# Patient Record
Sex: Female | Born: 1945
Health system: Southern US, Community
[De-identification: ages and names within clinical notes are randomized; demographics above are authoritative.]

## PROBLEM LIST (undated history)

## (undated) DIAGNOSIS — E669 Obesity, unspecified: Secondary | ICD-10-CM

## (undated) DIAGNOSIS — F329 Major depressive disorder, single episode, unspecified: Secondary | ICD-10-CM

## (undated) DIAGNOSIS — F32A Depression, unspecified: Secondary | ICD-10-CM

## (undated) DIAGNOSIS — M1990 Unspecified osteoarthritis, unspecified site: Secondary | ICD-10-CM

## (undated) DIAGNOSIS — I10 Essential (primary) hypertension: Secondary | ICD-10-CM

## (undated) DIAGNOSIS — M797 Fibromyalgia: Secondary | ICD-10-CM

## (undated) DIAGNOSIS — E785 Hyperlipidemia, unspecified: Secondary | ICD-10-CM

## (undated) DIAGNOSIS — E039 Hypothyroidism, unspecified: Secondary | ICD-10-CM

## (undated) DIAGNOSIS — C801 Malignant (primary) neoplasm, unspecified: Secondary | ICD-10-CM

## (undated) DIAGNOSIS — M2142 Flat foot [pes planus] (acquired), left foot: Secondary | ICD-10-CM

## (undated) DIAGNOSIS — M2141 Flat foot [pes planus] (acquired), right foot: Secondary | ICD-10-CM

## (undated) HISTORY — DX: Essential (primary) hypertension: I10

## (undated) HISTORY — DX: Unspecified osteoarthritis, unspecified site: M19.90

## (undated) HISTORY — DX: Major depressive disorder, single episode, unspecified: F32.9

## (undated) HISTORY — DX: Hypothyroidism, unspecified: E03.9

## (undated) HISTORY — DX: Malignant (primary) neoplasm, unspecified: C80.1

## (undated) HISTORY — DX: Flat foot (pes planus) (acquired), right foot: M21.42

## (undated) HISTORY — DX: Flat foot (pes planus) (acquired), right foot: M21.41

## (undated) HISTORY — DX: Fibromyalgia: M79.7

## (undated) HISTORY — DX: Hyperlipidemia, unspecified: E78.5

## (undated) HISTORY — DX: Depression, unspecified: F32.A

## (undated) HISTORY — DX: Obesity, unspecified: E66.9

---
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
## 1975-12-07 HISTORY — PX: TUBAL LIGATION: SHX77

## 1992-12-06 HISTORY — PX: CHOLECYSTECTOMY: SHX55

## 1998-12-08 ENCOUNTER — Other Ambulatory Visit: Admission: RE | Admit: 1998-12-08 | Discharge: 1998-12-08 | Payer: Self-pay | Admitting: Obstetrics and Gynecology

## 2000-02-19 ENCOUNTER — Other Ambulatory Visit: Admission: RE | Admit: 2000-02-19 | Discharge: 2000-02-19 | Payer: Self-pay | Admitting: Obstetrics and Gynecology

## 2000-12-15 ENCOUNTER — Other Ambulatory Visit: Admission: RE | Admit: 2000-12-15 | Discharge: 2000-12-15 | Payer: Self-pay | Admitting: Obstetrics and Gynecology

## 2004-10-22 ENCOUNTER — Other Ambulatory Visit: Admission: RE | Admit: 2004-10-22 | Discharge: 2004-10-22 | Payer: Self-pay | Admitting: Obstetrics and Gynecology

## 2005-12-03 ENCOUNTER — Other Ambulatory Visit: Admission: RE | Admit: 2005-12-03 | Discharge: 2005-12-03 | Payer: Self-pay | Admitting: Obstetrics and Gynecology

## 2006-12-06 HISTORY — PX: ANTERIOR CRUCIATE LIGAMENT REPAIR: SHX115

## 2007-12-07 HISTORY — PX: TOTAL KNEE ARTHROPLASTY: SHX125

## 2008-10-11 ENCOUNTER — Inpatient Hospital Stay (HOSPITAL_COMMUNITY): Admission: RE | Admit: 2008-10-11 | Discharge: 2008-10-17 | Payer: Self-pay | Admitting: Orthopedic Surgery

## 2009-02-13 ENCOUNTER — Encounter: Payer: Self-pay | Admitting: Gastroenterology

## 2009-02-20 ENCOUNTER — Ambulatory Visit: Payer: Self-pay | Admitting: Gastroenterology

## 2009-02-25 ENCOUNTER — Telehealth: Payer: Self-pay | Admitting: Gastroenterology

## 2009-03-05 ENCOUNTER — Ambulatory Visit: Payer: Self-pay | Admitting: Gastroenterology

## 2009-03-05 ENCOUNTER — Encounter: Payer: Self-pay | Admitting: Gastroenterology

## 2009-03-06 ENCOUNTER — Telehealth: Payer: Self-pay | Admitting: Gastroenterology

## 2009-03-06 HISTORY — PX: TRANSTHORACIC ECHOCARDIOGRAM: SHX275

## 2009-03-06 HISTORY — PX: OTHER SURGICAL HISTORY: SHX169

## 2009-03-10 ENCOUNTER — Encounter: Payer: Self-pay | Admitting: Gastroenterology

## 2009-09-14 IMAGING — CR DG KNEE 1-2V PORT*R*
2 series · 2 of 2 positions shown · non-contrast
Comparison: None.

CLINICAL DATA: Status post right knee total prosthesis placement
for osteoarthritis.

PORTABLE RIGHT KNEE - 1-2 VIEW

[view not recorded (1 of 2)]
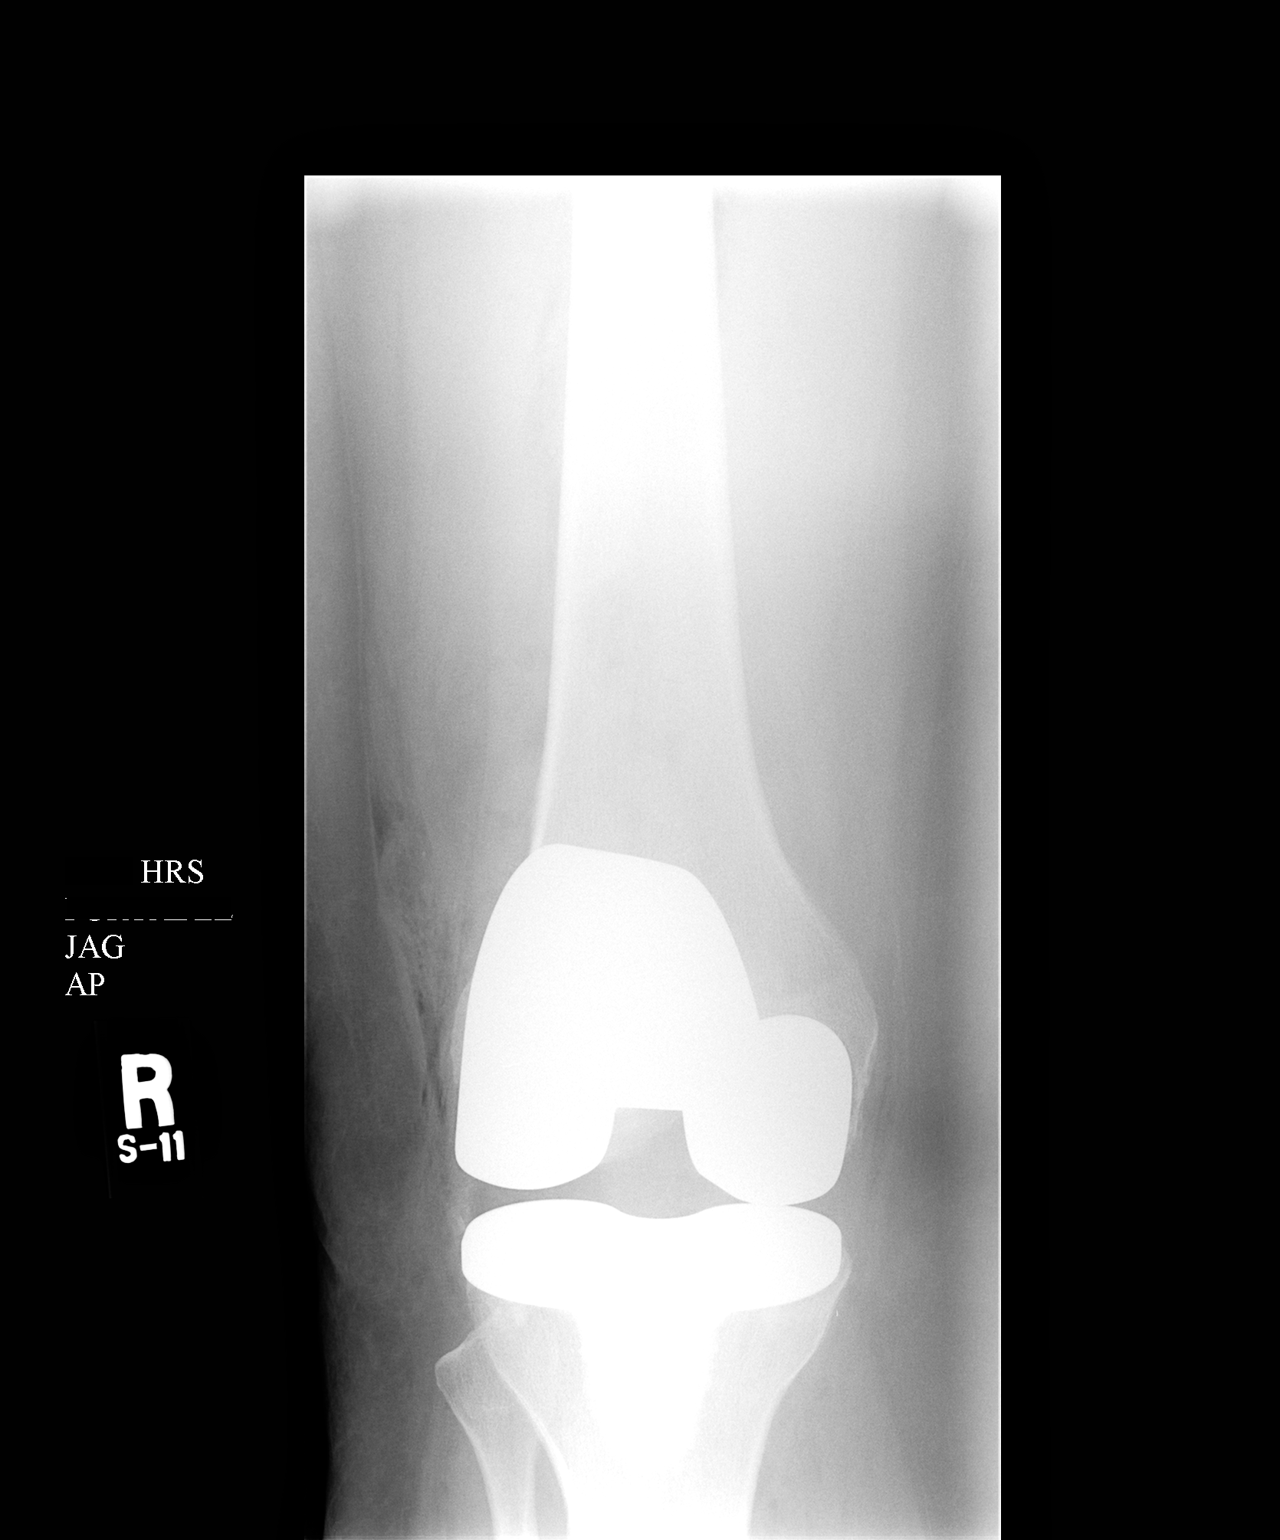

[view not recorded (2 of 2)]
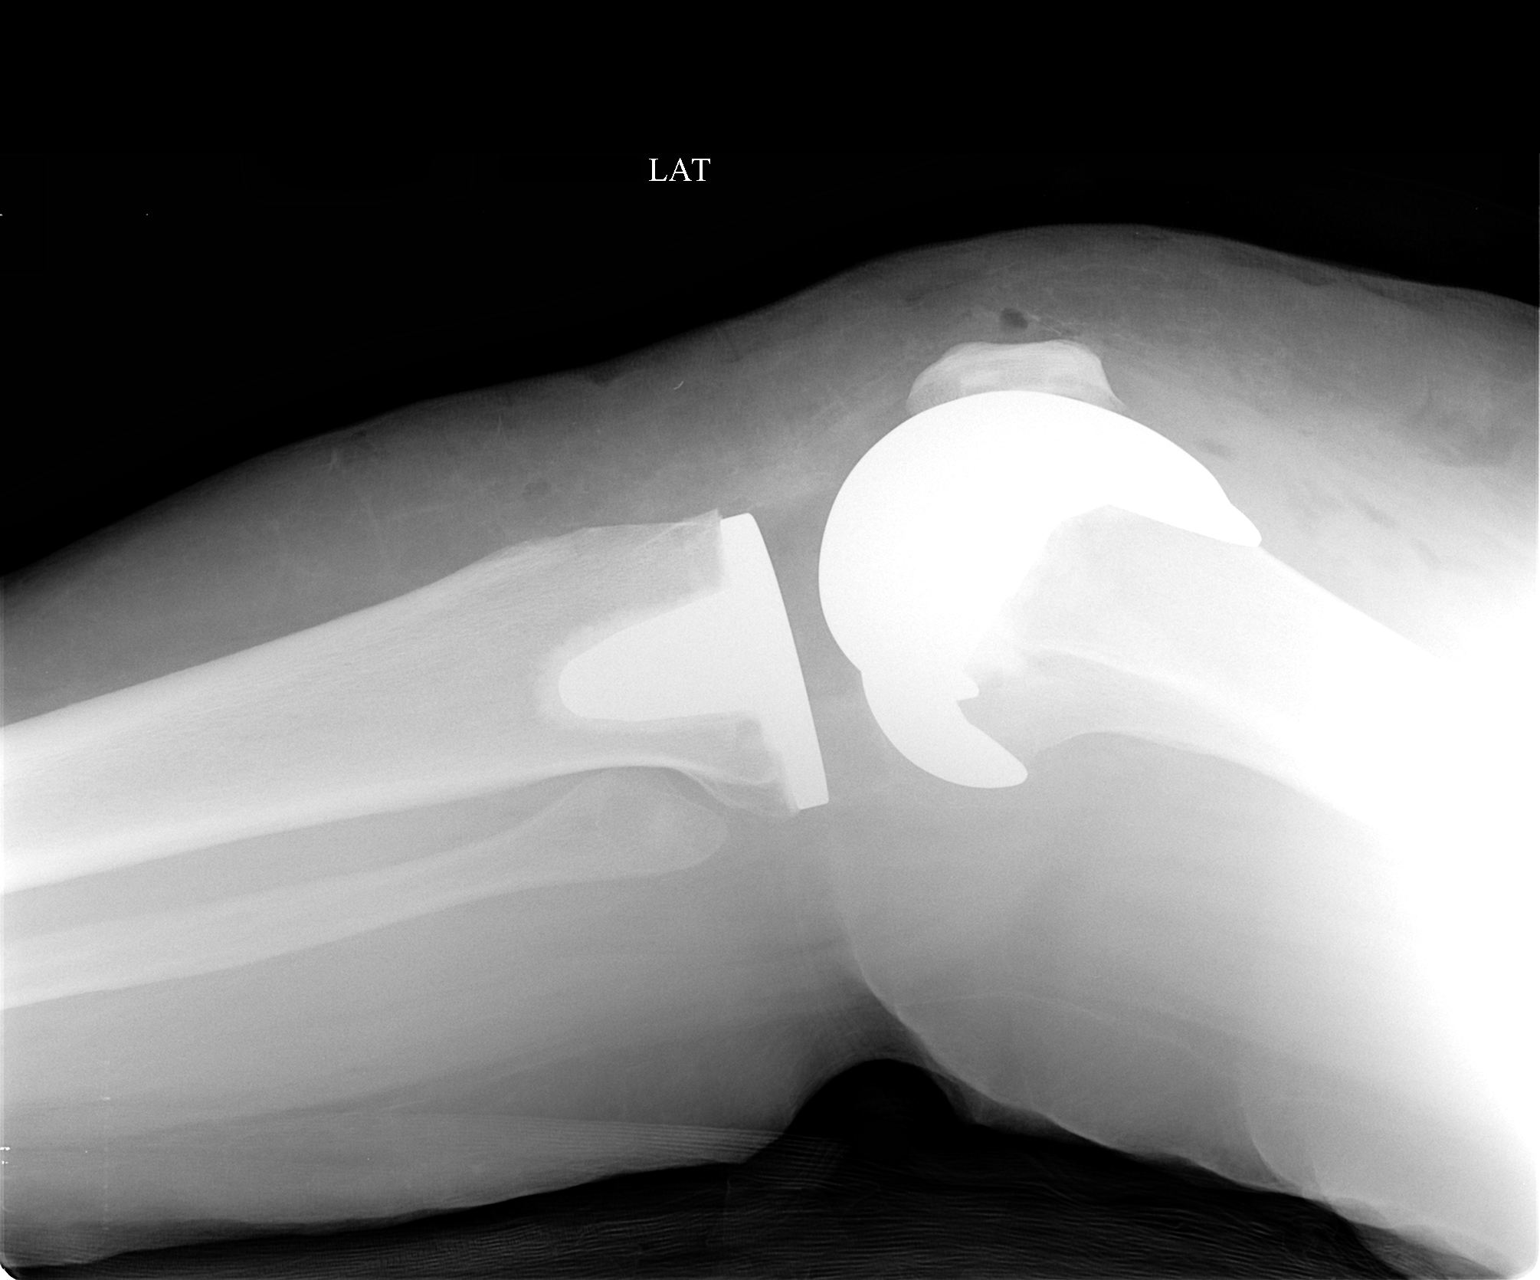

[2 of 2 positions shown; findings below may reference images not displayed]

FINDINGS: Right total knee prosthesis in satisfactory position and
alignment.  No fracture or dislocation seen.
IMPRESSION: Satisfactory postoperative appearance of a right total knee
prosthesis.

## 2011-03-01 ENCOUNTER — Other Ambulatory Visit: Payer: Self-pay | Admitting: Obstetrics and Gynecology

## 2011-03-01 DIAGNOSIS — R928 Other abnormal and inconclusive findings on diagnostic imaging of breast: Secondary | ICD-10-CM

## 2011-03-19 ENCOUNTER — Ambulatory Visit
Admission: RE | Admit: 2011-03-19 | Discharge: 2011-03-19 | Disposition: A | Discharge status: Home / Self Care | Payer: Medicare Other | Source: Ambulatory Visit | Attending: Obstetrics and Gynecology | Admitting: Obstetrics and Gynecology

## 2011-03-19 DIAGNOSIS — R928 Other abnormal and inconclusive findings on diagnostic imaging of breast: Secondary | ICD-10-CM

## 2011-04-20 NOTE — Op Note (Signed)
Alyssa, Park NO.:  [id number]   MEDICAL RECORD NO.:  [id number]          PATIENT TYPE:  INP   LOCATION:  5020                         FACILITY:  [facility]   PHYSICIAN:  Almedia Balls. [name], M.D. DATE OF BIRTH:  03-10-1946   DATE OF PROCEDURE:  10/11/2008  DATE OF DISCHARGE:                               OPERATIVE REPORT   PREOPERATIVE DIAGNOSIS:  Right knee end-stage osteoarthritis.   POSTOPERATIVE DIAGNOSIS:  Right knee end-stage osteoarthritis.   PROCEDURE PERFORMED:  Right total knee replacement using DePuy Sigma  rotating-platform prosthesis.   SURGEON:  Almedia Balls. Alyssa Patrick, MD   ASSISTANT:  Donnie Coffin. Dixon, PA-C.   ANESTHESIA:  General anesthesia was used.   ESTIMATED BLOOD LOSS:  Minimal.   FLUID REPLACEMENT:  15 mL crystalloid.   URINE OUTPUT:  200 mL.   TOURNIQUET TIME:  1 hour 30 minutes at 350 mmHg.   INSTRUMENT COUNTS:  Correct.   COMPLICATIONS:  There were no complications.   Perioperative antibiotics were given.   INDICATIONS:  The patient is a 65 year old female with a history of  worsening right knee pain.  The patient has failed conservative  management and complains of disabling pain and functional loss, desiring  a total knee replacement.  Informed consent was obtained.   DESCRIPTION OF PROCEDURE:  After an adequate level of anesthesia was  achieved, the patient was positioned supine on the operating room table.  A bump was used  under the right hip.  A nonsterile tourniquet was  placed on the right proximal thigh.  The right leg was sterilely prepped  and draped in the usual manner.  We exsanguinated the limb using an  Esmarch bandage and elevated the tourniquet to 350 mmHg.  A longitudinal  midline incision was created with the knee in flexion.  Median  parapatellar arthrotomy was performed with 10 blade.  The patella was  everted, lateral patellar femoral ligaments were divided.  Distal femur  was entered using a step-cut  drill.  We resected 10 mm off the femur, 75  degrees right.  We next went ahead and cut the tibia.  We performed a 90  degree perpendicular cut after subluxing the tibia anteriorly having  divided the PCL and ACL.  This was 4 mm off the affected medial side.  We then went ahead and checked our extension block, it was a little  tight.  We then resected 2 more mm off and I gave this a nice extension  gap.  We then went ahead and did out 4 in 1 guide for the distal femur  resecting anterior, posterior, and chamfer cuts, we sized this to a size  3 on the femur.  We then checked our flexion extension gaps again.  They  were symmetric at 10 mm.  At this point, we removed excess posterior  bone off the posterior femoral condyles.  We then went ahead and  prepared the tibia, was sized to 2.5 and used a modular drill and keel  punch to finish preparation of the tibia and placed a tibial tray on and  this fit well.  Next, we went ahead and resected the notch using the box-  cut guide over the posterior cruciate substituting prosthesis, this was  a size 3 and then we used a size 3 trial and then packed it in place.  We placed a 12.5 insert and placed in equal extension.  We were able to  achieve full extension with good flexion and extension instability.  We  next used a patellar resection guide to go ahead and resect her patella.  We did resurfacing technique, started about 20-22 mm thickness, we  resected down the 13 and then placed a 32 patellar button in place and  then took the knee to a full range of motion, excellent patellar  balance, and tracking was noted.  We have removed all trial components,  thoroughly irrigated, plugged the end of the femur with available bone.  We then cemented the components into place, 2.5 tibia, 3 right femur,  and then a 12.5 poly insert, and then the 32 patella.  Once the cement  was allowed to harden, we removed the excess cement with 0.25-inch  curved  osteotome.  We trialed again with 12.5, we were happy with that  and we selected the real 12.5 tibial insert and then took the knee  through a full range of motion.  We were happy with soft tissue balance  and stability, and then went ahead and closed using a #1 Vicryl suture  in a figure-of-eight fashion to the median parapatellar arthrotomy, 0  and 2-0 Vicryl layer closure for the subcutaneous tissues, and 4-0  Monocryl for skin.  Steri-strips were applied followed by sterile  dressing.  The patient tolerated the surgery well.      Almedia Balls. [name], M.D.  Electronically Signed     SRN/MEDQ  D:  10/11/2008  T:  10/12/2008  Job:  [id number]

## 2011-04-20 NOTE — H&P (Signed)
[name], [name] NO.:  [id number]   MEDICAL RECORD NO.:  [id number]          PATIENT TYPE:  INP   LOCATION:  NA                           FACILITY:  [facility]   PHYSICIAN:  Almedia Balls. Ranell Patrick, M.D. DATE OF BIRTH:  [birth year]   DATE OF ADMISSION:  DATE OF DISCHARGE:                              HISTORY & PHYSICAL   CHIEF COMPLAINT:  Right knee pain.   HISTORY OF PRESENT ILLNESS:  The patient is a 65 year old female with  worsening right knee pain secondary to osteoarthritis that has been  refractory to conservative treatment.  The patient has elected to have a  right total knee arthroplasty by Dr. Malon Kindle.   PAST MEDICAL HISTORY:  1. Hyperlipidemia.  2. Migraines.  3. Anxiety.  4. Fibromyalgia.  5. Irritable bowel syndrome.   FAMILY MEDICAL HISTORY:  Coronary artery disease and hypertension.   SOCIAL HISTORY:  The patient is at [facility].  Does not smoke or  use alcohol.   DRUG ALLERGIES:  COMPAZINE.   CURRENT MEDICATIONS:  Hydrochlorothiazide, Effexor, Synthroid,  amitriptyline, pravastatin, Darvocet, alprazolam.  Please check the  dosages with the patient upon arrival.   REVIEW OF SYSTEMS:  Pain with ambulation and also chronic constipation.   PHYSICAL EXAMINATION:  VITAL SIGNS:  Pulse 80, respirations 18, blood  pressure 132/80.  GENERAL:  The patient is a healthy-appearing 65 year old female in no  acute distress, pleasant mood and affect, oriented x3.  HEAD and NECK:  Cranial nerves II through XII grossly intact.  She has  no tenderness.  LUNGS:  Active breath sounds bilaterally.  HEART:  Regular rate and rhythm.  No murmur.  ABDOMEN:  Nontender, nondistended with active bowel sounds.  EXTREMITIES:  Moderate tenderness through right knee and medial joint  line tenderness through the right knee with crepitus.  She does have  mildly antalgic gait, but neurovascularly, she is intact.  Distally,  there is some minimal pedal edema  bilaterally.  SKIN:  No rashes.   X-rays show end-stage osteoarthritis of the right knee.   IMPRESSION:  End-stage osteoarthritis, right knee.   PLAN:  Plan of action is to have right total knee arthroplasty by Dr.  Malon Kindle.      Thomas B. Durwin Nora, P.A.      Almedia Balls. Ranell Patrick, M.D.  Electronically Signed    TBD/MEDQ  D:  10/02/2008  T:  10/03/2008  Job:  [id number]

## 2011-04-23 NOTE — Discharge Summary (Signed)
[name], [name] NO.:  [id number]   MEDICAL RECORD NO.:  [id number]          PATIENT TYPE:  INP   LOCATION:  5020                         FACILITY:  [facility]   PHYSICIAN:  Almedia Balls. Ranell Patrick, M.D. DATE OF BIRTH:  17-Sep-1946   DATE OF ADMISSION:  10/11/2008  DATE OF DISCHARGE:  10/17/2008                               DISCHARGE SUMMARY   ADMITTING DIAGNOSIS:  Right knee, end-stage osteoarthritis.   DISCHARGE DIAGNOSIS:  Right knee, end-stage osteoarthritis.   PROCEDURE PERFORMED:  Right total knee replacement performed on October 11, 2008, by Dr. Ranell Patrick.   CONSULTING SERVICES:  Physical Therapy, Occupational Therapy, discharge  planning, and Pharmacy.   HISTORY OF PRESENT ILLNESS:  The patient is a 65 year old female with  worsening right knee pain secondary to known arthritis.  The patient has  failed conservative management and desires operative treatment.  She  presents now for total knee arthroplasty.  Risks and benefits have been  discussed.  Informed consent obtained.  For further details and the  patient's past medical history and physical examination, please see the  medical record.   HOSPITAL COURSE:  The patient was admitted by orthopedics on October 11, 2008, when she underwent uncomplicated total knee arthroplasty using  DePuy prosthesis.  Postoperatively, the patient was weightbearing as  tolerated after physical therapy.  ADLs with occupational therapy and  Pharmacy for Coumadin monitoring, which was instituted on the data of  surgery.  She was also started on mechanical devices including TED hose  and Plexipulse foot pumps.  Her H and H was watched postoperatively and  she had a slight anemia that was nonsymptomatic.  The patient was  scheduled for discharge on October 17, 2008, in stable condition on  Percocet and Robaxin and to follow up with [place] in 2 weeks.  She will be  on Coumadin, monitored outpatient through home health agency.  Weightbearing as tolerated.      Almedia Balls. Ranell Patrick, M.D.  Electronically Signed     SRN/MEDQ  D:  12/04/2008  T:  12/05/2008  Job:  [id number]

## 2011-09-07 LAB — BASIC METABOLIC PANEL
BUN: 17
BUN: 7
BUN: 7
CO2: 28
Calcium: 9.6
Chloride: 101
Chloride: 99
Creatinine, Ser: 0.9
Creatinine, Ser: 0.93
Creatinine, Ser: 0.98
GFR calc non Af Amer: 58 — ABNORMAL LOW
Glucose, Bld: 102 — ABNORMAL HIGH
Glucose, Bld: 137 — ABNORMAL HIGH
Sodium: 137

## 2011-09-07 LAB — CBC
HCT: 32.5 — ABNORMAL LOW
HCT: 32.8 — ABNORMAL LOW
HCT: 32.8 — ABNORMAL LOW
HCT: 40
Hemoglobin: 10.9 — ABNORMAL LOW
Hemoglobin: 11.1 — ABNORMAL LOW
MCHC: 33.1
MCHC: 33.3
MCV: 88.5
MCV: 89.4
MCV: 90.7
Platelets: 219
Platelets: 237
RBC: 3.7 — ABNORMAL LOW
RBC: 4.47
RDW: 14.4
RDW: 14.6
WBC: 7.2
WBC: 7.4

## 2011-09-07 LAB — URINALYSIS, ROUTINE W REFLEX MICROSCOPIC
Glucose, UA: NEGATIVE
Nitrite: NEGATIVE

## 2011-09-07 LAB — ABO/RH: ABO/RH(D): A POS

## 2011-09-07 LAB — DIFFERENTIAL
Eosinophils Absolute: 0
Eosinophils Relative: 0
Lymphocytes Relative: 33
Lymphs Abs: 1.9
Monocytes Relative: 8
Neutro Abs: 3.3
Neutrophils Relative %: 58

## 2011-09-07 LAB — URINE MICROSCOPIC-ADD ON

## 2011-09-07 LAB — PROTIME-INR
INR: 1
INR: 1.2
INR: 1.4
INR: 2.2 — ABNORMAL HIGH
INR: 2.2 — ABNORMAL HIGH
Prothrombin Time: 12.9
Prothrombin Time: 18 — ABNORMAL HIGH

## 2011-09-07 LAB — TYPE AND SCREEN: Antibody Screen: NEGATIVE

## 2012-02-20 IMAGING — MG MM DIGITAL DIAGNOSTIC UNILAT R {BCG}
3 series · 3 of 3 positions shown · non-contrast
Comparison: Multiple priors

CLINICAL DATA: Abnormal screening, right breast

DIGITAL DIAGNOSTIC RIGHT MAMMOGRAM WITHOUT CAD

[R CC]
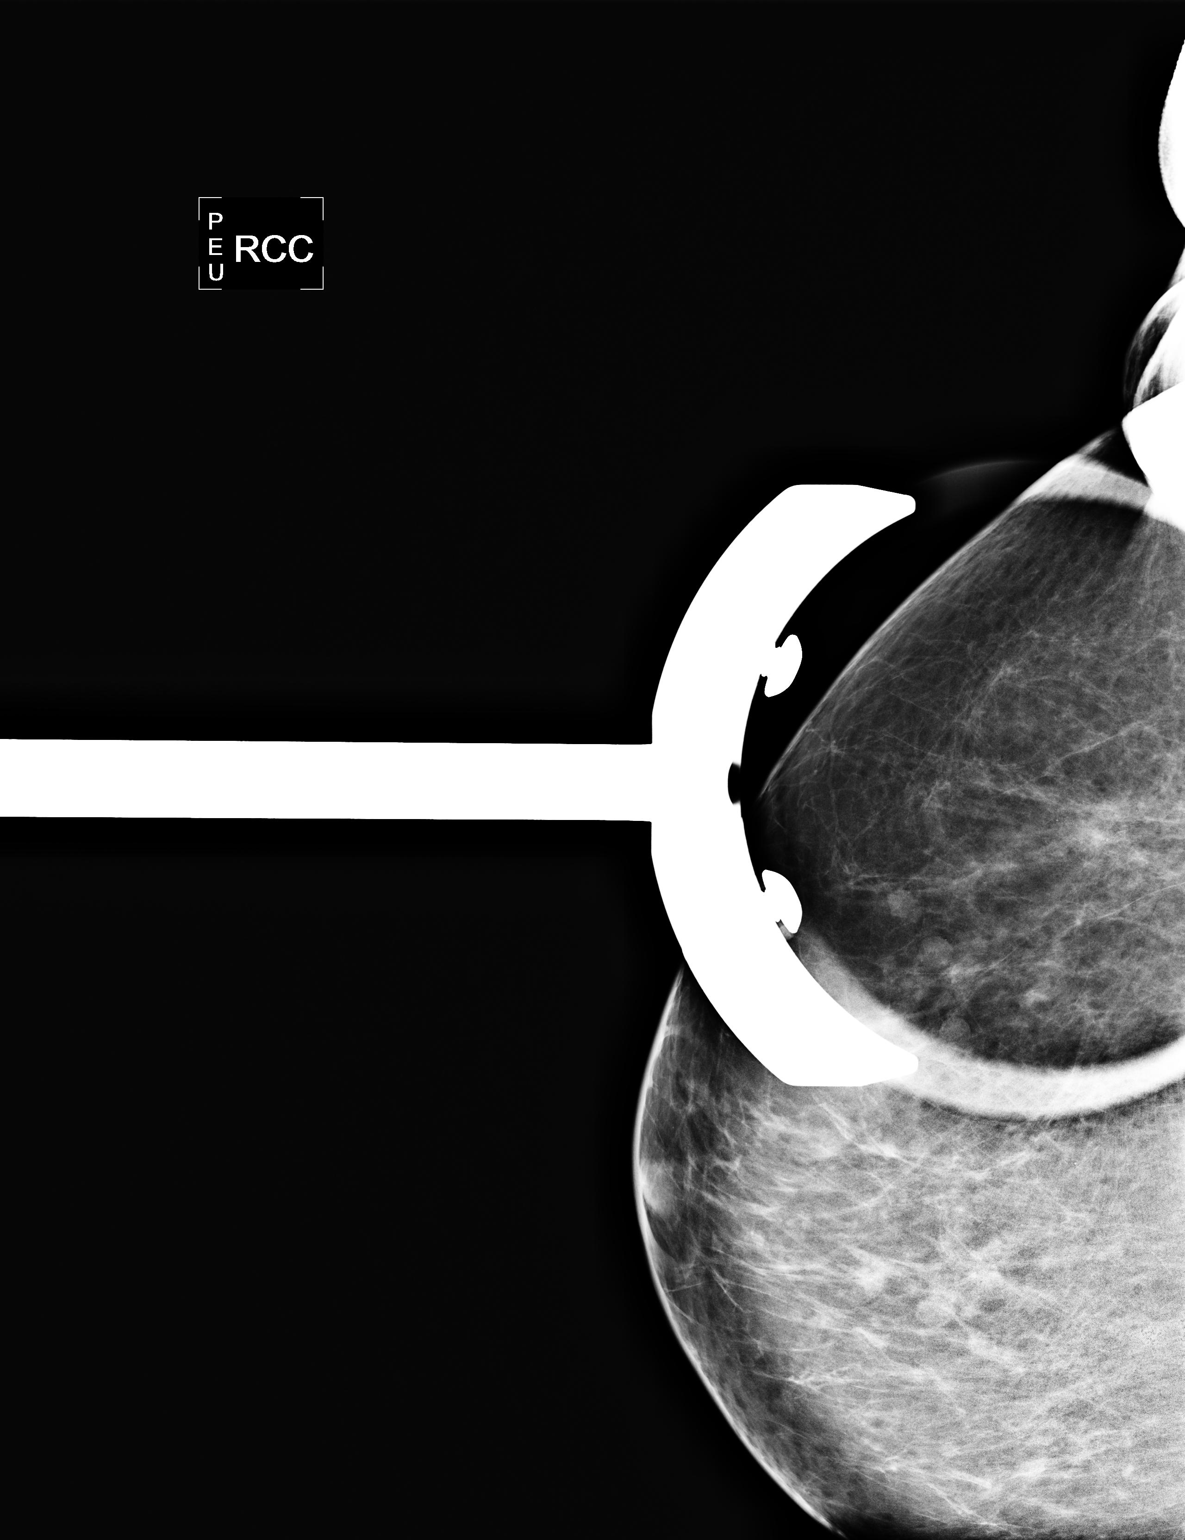

[R MLO (1 of 2)]
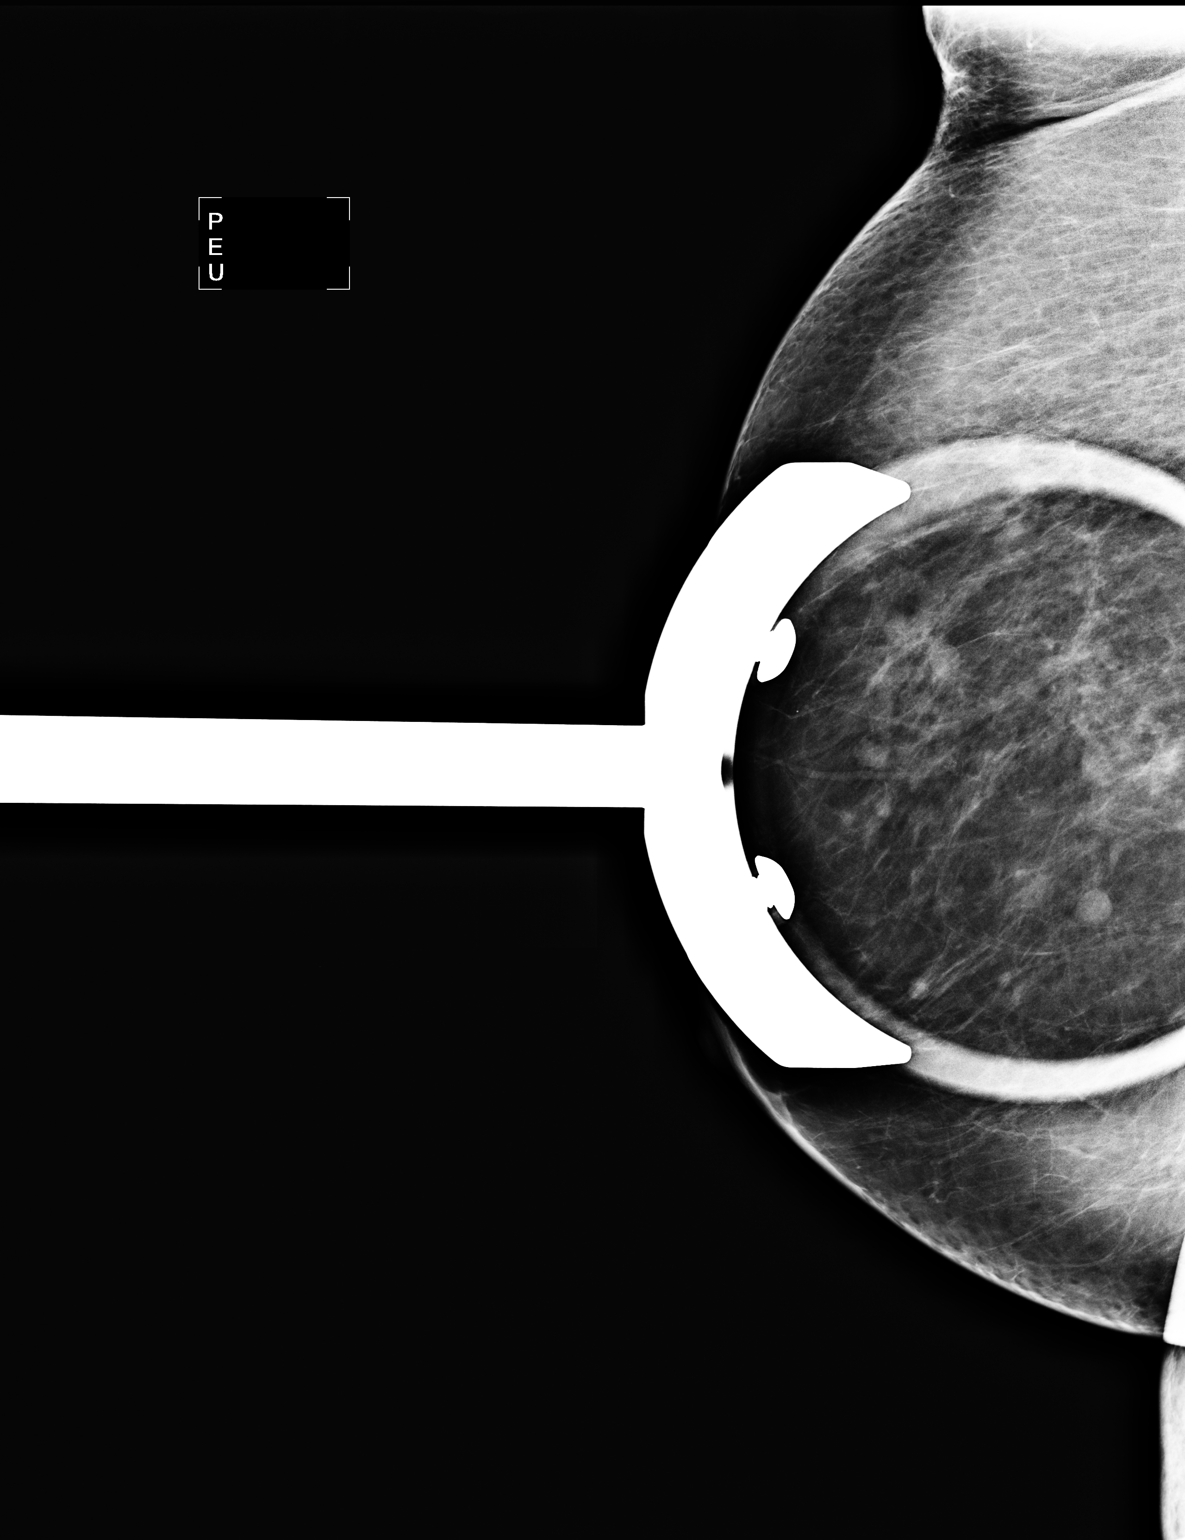

[R MLO (2 of 2)]
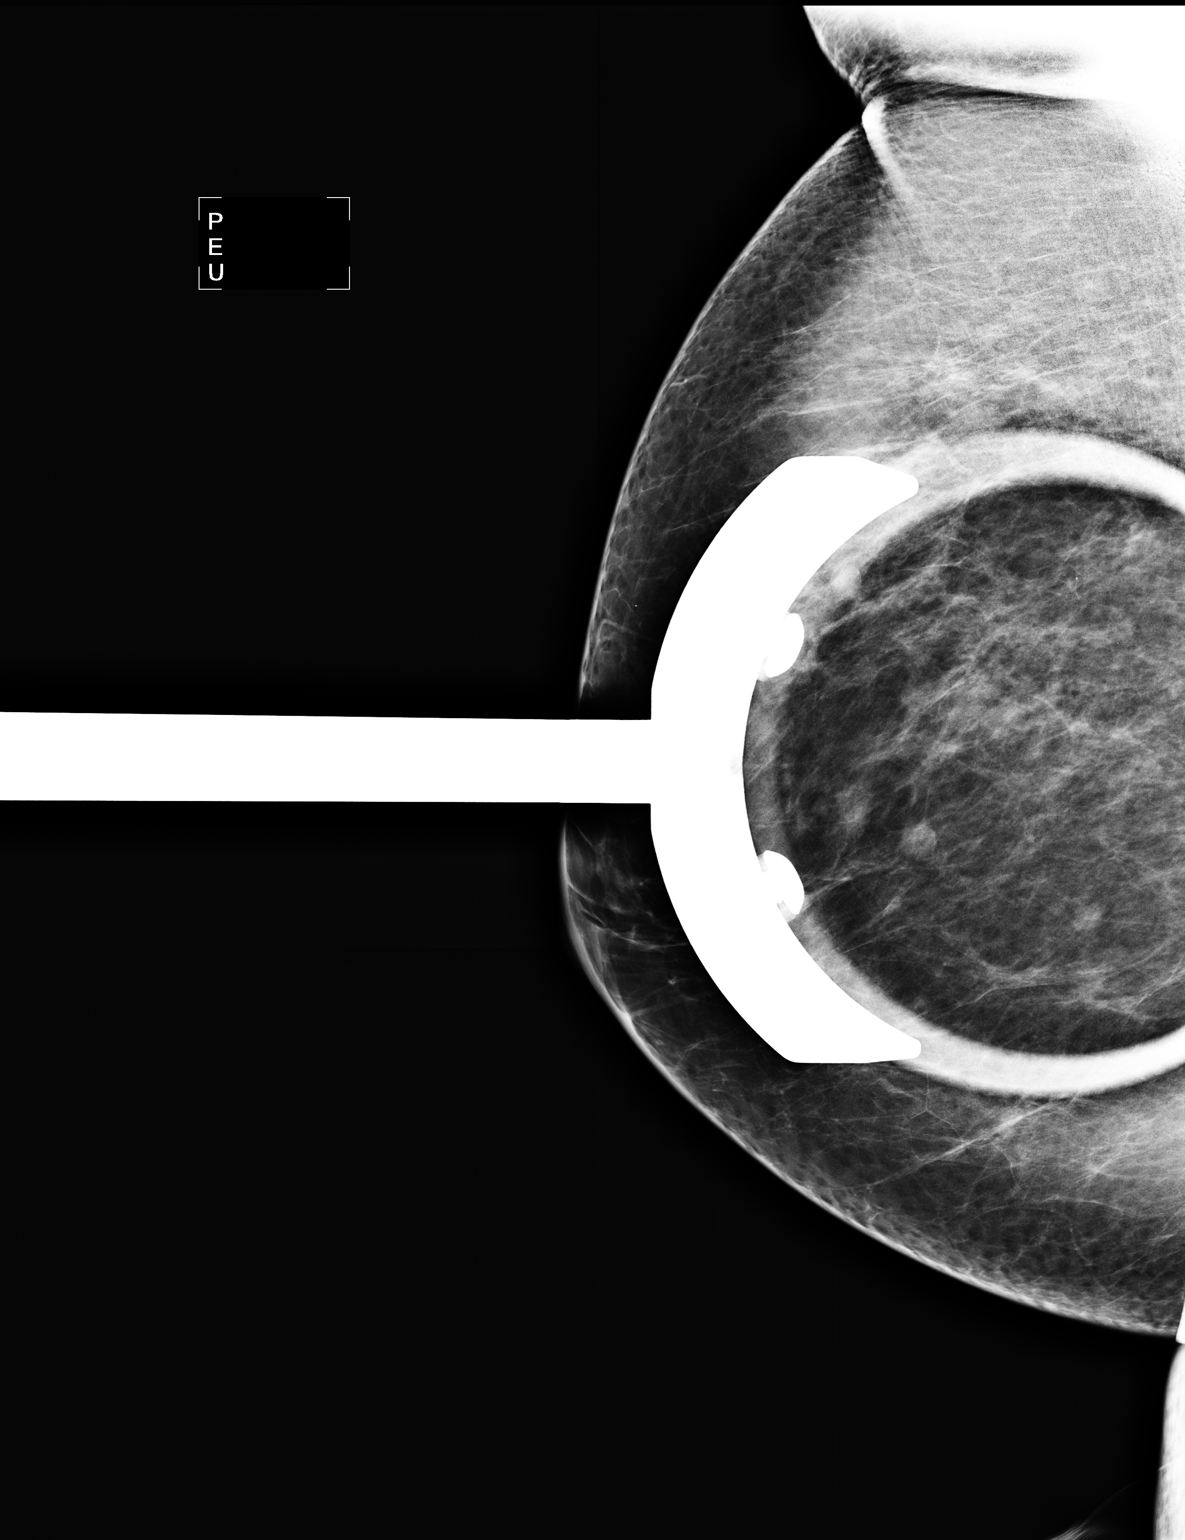

[3 of 3 positions shown; findings below may reference images not displayed]

FINDINGS: Spot compression views in the upper-outer quadrant of
the right breast, posteriorly demonstrate fibroglandular tissue
without new mass or persistent distortion.  Compared to priors.
IMPRESSION: No mammographic evidence of malignancy, right breast.  Recommend
routine screening mammography in 1 year.

BI-RADS CATEGORY 1:  Negative.

## 2012-04-05 ENCOUNTER — Telehealth: Payer: Self-pay | Admitting: Gastroenterology

## 2012-04-05 NOTE — Telephone Encounter (Signed)
Pt last seen in 2010; last COLON 03/05/2009 with diverticulosis and hyperplastic rectal polyps. Pt reports several days of burning in her lower abdomen and bloating. She denies diarrhea, melena or a temp. She reports the lower abdomen is tender to the touch and hurts when she gets up from a sitting position. Pt given an appt with Mike Gip, PA tomorrow.

## 2012-04-06 ENCOUNTER — Ambulatory Visit: Payer: Medicare Other | Admitting: Physician Assistant

## 2013-05-29 ENCOUNTER — Other Ambulatory Visit: Payer: Self-pay

## 2013-05-29 MED ORDER — CARVEDILOL 6.25 MG PO TABS: 6.2500 mg | ORAL_TABLET | Freq: Two times a day (BID) | ORAL | Status: DC

## 2013-05-29 NOTE — Addendum Note (Signed)
Addended by: Neta Ehlers on: 05/29/2013 08:24 AM   Modules accepted: Orders

## 2013-05-29 NOTE — Telephone Encounter (Signed)
Rx was sent to pharmacy electronically. 

## 2013-09-24 ENCOUNTER — Encounter: Payer: Self-pay | Admitting: Cardiology

## 2013-11-06 ENCOUNTER — Encounter: Payer: Self-pay | Admitting: Cardiology

## 2013-11-06 ENCOUNTER — Ambulatory Visit (INDEPENDENT_AMBULATORY_CARE_PROVIDER_SITE_OTHER): Payer: Medicare Other | Admitting: Cardiology

## 2013-11-06 VITALS — BP 118/70 | HR 65 | Ht 66.0 in | Wt 204.7 lb

## 2013-11-06 DIAGNOSIS — I10 Essential (primary) hypertension: Secondary | ICD-10-CM

## 2013-11-06 DIAGNOSIS — E785 Hyperlipidemia, unspecified: Secondary | ICD-10-CM

## 2013-11-06 DIAGNOSIS — E669 Obesity, unspecified: Secondary | ICD-10-CM

## 2013-11-06 NOTE — Progress Notes (Signed)
PATIENT: BLAISE PALLADINO MRN: [id number]  DOB: 02-Apr-1946   DOV:11/08/2013 PCP: Ailene Ravel, MD  Clinic Note: Chief Complaint  Patient presents with  . Annual Exam    No chest pain, SOB, edema or dizziness.    HPI: BRADLEY HANDYSIDE is a 67 y.o.  female with a PMH below who presents today for annual followup for hypertension and hyperlipidemia, who also has a significant family history (father had aortic stenosis as well as coronary disease status post CABG). I last saw her in December 2013. At that time she is doing quite well, no longer bothered by palpitations. She was tolerating statin well and her blood pressure is well-controlled. She had had some wavering of her weight loss but is partly related to him having to take care of her husband recovering from CVA.  Interval History: Since her last visit, she seen to be done quite well without any major symptoms. She still not really exercising she wanted to him. It's just been hard for her to get back to doing it. She her husband and start her walking and she knows she needs to get back to doing it with him.  Cardiovascular ROS: no chest pain or dyspnea on exertion negative for - chest pain, edema, irregular heartbeat, loss of consciousness, murmur, orthopnea, palpitations, paroxysmal nocturnal dyspnea, rapid heart rate, shortness of breath or  TIA/amaurosis fugax. No melena, hematochezia or hematuria.: No claudication symptoms. She did have one episode where she passed out after having knee surgery but has not had anything else and statin I was quite a long time ago.  Past Medical History  Diagnosis Date  . Hypertension, essential, benign   . Hyperlipidemia   . Mild obesity   . Hypothyroidism   . Depression, controlled     Dysthymia  . Fibromyalgia   . Pes planus (flat feet)     Prior Cardiac Evaluation and Past Surgical History: Past Surgical History  Procedure Laterality Date  . Transthoracic echocardiogram  April 2010    Normal  EF. Relaxation. Otherwise normal  . Nm persantine myoview ltd  April 2010    Breast attenuation, no ischemia or infarction. Normal function  . Carotid dopplers  April 2010    Tortuosity but no stenosis   1. Echocardiogram April 2010: EF normal with impaired relaxation, otherwise grossly normal. 2. Persantine Myoview April 2010: Breast attenuation with no ischemia or infarction. 3. Carotid Dopplers April 2010 for possible syncope showed tortuosity but no diameter reduction.   Allergies  Allergen Reactions  . Compazine [Prochlorperazine Edisylate]     Muscle spasms  . Prochlorperazine Edisylate     REACTION: Extra pyrimadal Syndrome    Current Outpatient Prescriptions  Medication Sig Dispense Refill  . carvedilol (COREG) 6.25 MG tablet Take 1 tablet (6.25 mg total) by mouth 2 (two) times daily.  180 tablet  2  . levothyroxine (SYNTHROID, LEVOTHROID) 75 MCG tablet Take 75 mcg by mouth daily before breakfast.      . lisinopril (PRINIVIL,ZESTRIL) 10 MG tablet Take 10 mg by mouth daily.      . pravastatin (PRAVACHOL) 20 MG tablet Take 20 mg by mouth daily.      Marland Kitchen venlafaxine XR (EFFEXOR-XR) 75 MG 24 hr capsule Take 75 mg by mouth daily.       No current facility-administered medications for this visit.    History   Social History Narrative   She is a married mother of 2, grandmother 4. She never smoked and does not  drink alcohol.   She does not get routine exercise, and admitted he needs to.   ROS: A comprehensive Review of Systems - Negative except Occasional orthostatic dizziness but nothing that bothers her. Lack of energy.  PHYSICAL EXAM BP 118/70  Pulse 65  Ht 5\' 6"  (1.676 m)  Wt 92.851 kg (204 lb 11.2 oz)  BMI 33.06 kg/m2 (198 last year) General appearance: alert, cooperative, appears stated age, no distress, mildly obese and Pleasant mood and affect. Well-nourished and well-groomed. Neck: no adenopathy, no carotid bruit, no JVD, supple, symmetrical, trachea midline,  thyroid not enlarged, symmetric, no tenderness/mass/nodules and HEENT: [place]/AT, EOMI, MMM Lungs: clear to auscultation bilaterally, normal percussion bilaterally and Nonlabored, and air movement. Heart: regular rate and rhythm, S1, S2 normal, no murmur, click, rub or gallop and normal apical impulse Abdomen: soft, non-tender; bowel sounds normal; no masses,  no organomegaly and Mild obesity Extremities: extremities normal, atraumatic, no cyanosis or edema and no ulcers, gangrene or trophic changes Pulses: 2+ and symmetric Neurologic: Grossly normal  [id number] today: Yes Rate: 65 , Rhythm: NSR, normal ECG;  Recent Labs: Lipid Check by PCP 6 months ago, was told that her labs were "okay "  ASSESSMENT / PLAN: Hypertension, essential, benign Very well controlled on current regimen. She is on low dose of beta blocker and ACE inhibitor. No changes.  Hyperlipidemia This is then followed by her primary physician. She is on pravastatin.  Mild obesity Again discussed the need to increase her exercise regimen. She needs to get into a routine of exercising starting with  3-5 days a week of 10-15 minutes increasing up to 25-30 minutes. Also discussed potential dietary modifications with a healthy diet choices.    Orders Placed This Encounter  Procedures  . EKG 12-Lead    Followup: 1 yr  Aribelle Mccosh W. Herbie Baltimore, M.D., M.S. [facility] [address] [place], [place]  [address]  [phone] Pager # [phone]

## 2013-11-06 NOTE — Patient Instructions (Signed)
Continue current medications.  Dr. Herbie Baltimore recommends that you schedule a follow-up appointment in: One year.

## 2013-11-08 ENCOUNTER — Encounter: Payer: Self-pay | Admitting: Cardiology

## 2013-11-08 DIAGNOSIS — I10 Essential (primary) hypertension: Secondary | ICD-10-CM | POA: Insufficient documentation

## 2013-11-08 DIAGNOSIS — E669 Obesity, unspecified: Secondary | ICD-10-CM | POA: Insufficient documentation

## 2013-11-08 DIAGNOSIS — E785 Hyperlipidemia, unspecified: Secondary | ICD-10-CM | POA: Insufficient documentation

## 2013-11-08 NOTE — Assessment & Plan Note (Signed)
Very well controlled on current regimen. She is on low dose of beta blocker and ACE inhibitor. No changes.

## 2013-11-08 NOTE — Assessment & Plan Note (Signed)
This is then followed by her primary physician. She is on pravastatin.

## 2013-11-08 NOTE — Assessment & Plan Note (Signed)
Again discussed the need to increase her exercise regimen. She needs to get into a routine of exercising starting with  3-5 days a week of 10-15 minutes increasing up to 25-30 minutes. Also discussed potential dietary modifications with a healthy diet choices.

## 2014-02-22 ENCOUNTER — Other Ambulatory Visit: Payer: Self-pay | Admitting: *Deleted

## 2014-02-22 MED ORDER — CARVEDILOL 6.25 MG PO TABS: 6.2500 mg | ORAL_TABLET | Freq: Two times a day (BID) | ORAL | Status: DC

## 2014-02-26 ENCOUNTER — Other Ambulatory Visit: Payer: Self-pay | Admitting: *Deleted

## 2014-09-07 ENCOUNTER — Other Ambulatory Visit: Payer: Self-pay | Admitting: Cardiology

## 2014-09-09 NOTE — Telephone Encounter (Signed)
Rx was sent to pharmacy electronically. 

## 2014-10-29 ENCOUNTER — Other Ambulatory Visit: Payer: Self-pay | Admitting: Obstetrics and Gynecology

## 2014-11-01 LAB — CYTOLOGY - PAP

## 2014-11-11 ENCOUNTER — Encounter: Payer: Self-pay | Admitting: Cardiology

## 2014-11-11 ENCOUNTER — Ambulatory Visit (INDEPENDENT_AMBULATORY_CARE_PROVIDER_SITE_OTHER): Payer: Medicare Other | Admitting: Cardiology

## 2014-11-11 VITALS — BP 114/64 | HR 66 | Ht 65.75 in | Wt 207.0 lb

## 2014-11-11 DIAGNOSIS — I10 Essential (primary) hypertension: Secondary | ICD-10-CM

## 2014-11-11 DIAGNOSIS — E785 Hyperlipidemia, unspecified: Secondary | ICD-10-CM

## 2014-11-11 DIAGNOSIS — E669 Obesity, unspecified: Secondary | ICD-10-CM

## 2014-11-11 DIAGNOSIS — R002 Palpitations: Secondary | ICD-10-CM

## 2014-11-11 DIAGNOSIS — Z8249 Family history of ischemic heart disease and other diseases of the circulatory system: Secondary | ICD-10-CM

## 2014-11-11 MED ORDER — LISINOPRIL 10 MG PO TABS: 10.0000 mg | ORAL_TABLET | Freq: Every day | ORAL | Status: DC

## 2014-11-11 MED ORDER — PRAVASTATIN SODIUM 20 MG PO TABS: 20.0000 mg | ORAL_TABLET | Freq: Every day | ORAL | Status: DC

## 2014-11-11 MED ORDER — CARVEDILOL 6.25 MG PO TABS: 6.2500 mg | ORAL_TABLET | Freq: Two times a day (BID) | ORAL | Status: DC

## 2014-11-11 NOTE — Patient Instructions (Signed)
Dr Harding recommends that you schedule a follow-up appointment in 1 year. You will receive a reminder letter in the mail two months in advance. If you don't receive a letter, please call our office to schedule the follow-up appointment. 

## 2014-11-11 NOTE — Progress Notes (Signed)
PCP: Leonides Sake, MD  Clinic Note: Chief Complaint  Patient presents with  . Follow-up    Annual; family history of aortic stenosis and coronary disease  . Hypertension  . Hyperlipidemia   HPI: [name] is a 68 y.o. female with a PMH below who presents today for annual follow-up of hypertension and hyperlipidemia with a family history of cardiac disease including aortic stenosis and coronary disease in her father. Last seen in December 2014. She had been troubled by palpitations that were controlled at that time.   Past Medical History  Diagnosis Date  . Hypertension, essential, benign   . Hyperlipidemia   . Mild obesity   . Hypothyroidism   . Depression, controlled     Dysthymia  . Fibromyalgia   . Pes planus (flat feet)     Prior Cardiac Evaluation and Past Surgical History: Past Surgical History  Procedure Laterality Date  . Transthoracic echocardiogram  April 2010    Normal EF. Relaxation. Otherwise normal  . Nm persantine myoview ltd  April 2010    Breast attenuation, no ischemia or infarction. Normal function  . Carotid dopplers  April 2010    Tortuosity but no stenosis   Interval History: She has done well over the last year with no major complaints. She has some mild orthostatic hypotension dizziness, but has not had any syncope or near syncope, TIA/amaurosis fugax. No resting or exertional chest tightness/pressure or dyspnea. She has chronic allergy and cold symptoms.  No heart failure symptoms of PND, orthopnea or edema. No myalgias or arthralgias. She admits to not being as good as she should with watching her diet and getting her exercise in. She just basically cites being "lazy ".  ROS: A comprehensive was performed. Review of Systems  Constitutional: Negative for malaise/fatigue.  HENT: Positive for congestion (chronic post-nasal drip with cough). Negative for nosebleeds.   Eyes: Negative for blurred vision and double vision.  Respiratory: Positive  for cough (from allergies). Negative for hemoptysis, sputum production, shortness of breath and wheezing.   Cardiovascular: Negative for claudication and leg swelling.  Gastrointestinal: Negative for blood in stool and melena.  Genitourinary: Negative for hematuria.  Musculoskeletal: Negative for joint pain.  Neurological: Positive for dizziness (orthostatic dizziness - rare) and headaches (usually when she does not eat regulartly; no more migraines.). Negative for sensory change, speech change, focal weakness, seizures and loss of consciousness.  All other systems reviewed and are negative.   Current Outpatient Prescriptions on File Prior to Visit  Medication Sig Dispense Refill  . levothyroxine (SYNTHROID, LEVOTHROID) 75 MCG tablet Take 75 mcg by mouth daily before breakfast.    . venlafaxine XR (EFFEXOR-XR) 75 MG 24 hr capsule Take 75 mg by mouth daily.     No current facility-administered medications on file prior to visit.   ALLERGIES REVIEWED IN EPIC -- No change SOCIAL AND FAMILY HISTORY REVIEWED IN EPIC -- NO change  Wt Readings from Last 3 Encounters:  11/11/14 207 lb (93.895 kg)  11/06/13 204 lb 11.2 oz (92.851 kg)  Not exercising "like she should"  PHYSICAL EXAM BP 114/64 mmHg  Pulse 66  Ht 5' 5.75" (1.67 m)  Wt 207 lb (93.895 kg)  BMI 33.67 kg/m2 General appearance: alert, cooperative, appears stated age, no distress, mildly obese and Pleasant mood and affect. Well-nourished and well-groomed. Neck: no adenopathy, no carotid bruit, no JVD, supple, symmetrical, trachea midline, thyroid not enlarged, symmetric, no tenderness/mass/nodules and HEENT: /AT, EOMI, MMM Lungs: clear to auscultation  bilaterally, normal percussion bilaterally and Nonlabored, and air movement. Heart: regular rate and rhythm, S1, S2 normal, no murmur, click, rub or gallop and normal apical impulse Abdomen: soft, non-tender; bowel sounds normal; no masses, no organomegaly and Mild  obesity Extremities: extremities normal, atraumatic, no cyanosis or edema and no ulcers, gangrene or trophic changes Pulses: 2+ and symmetric Neurologic: Grossly normal   Adult ECG Report  Rate: 66 ;  Rhythm: normal sinus rhythm  Narrative Interpretation: Normal EKG   Recent Labs:    No results found for: CHOL, HDL, LDLCALC, LDLDIRECT, TRIG, CHOLHDL   ASSESSMENT / PLAN: Family history of heart disease Remains asymptomatic from a cardiac standpoint with no anginal or heart failure symptoms. She has had a relatively normal workup in the past. No need to perform screening tests unless she were to have symptoms. Just simply continue to treat risk factors.  Hyperlipidemia On pravastatin. Monitored by PCP. She  thinks that she had been checked by her PCP recently. I discussed results.  Hypertension, essential, benign Well-controlled on moderate-low dose beta blocker plus ACE inhibitor.  Intermittent palpitations Much improved with beta blocker dose. Not too high to cause fatigue.  Mild obesity Unfortunately, she slipped a little bit with her weight loss plan. We discussed healthy diet choices and other modifications as well as different exercise options. She is now has to get motivated to do it.    Orders Placed This Encounter  Procedures  . EKG 12-Lead   Refills  Meds ordered this encounter  Medications  . carvedilol (COREG) 6.25 MG tablet    Sig: Take 1 tablet (6.25 mg total) by mouth 2 (two) times daily with a meal.    Dispense:  180 tablet    Refill:  3  . lisinopril (PRINIVIL,ZESTRIL) 10 MG tablet    Sig: Take 1 tablet (10 mg total) by mouth daily.    Dispense:  90 tablet    Refill:  3  . pravastatin (PRAVACHOL) 20 MG tablet    Sig: Take 1 tablet (20 mg total) by mouth daily.    Dispense:  90 tablet    Refill:  3     Followup: 1 year f/u   HARDING,DAVID W, M.D., M.S. Interventional Cardiologist   Pager # [phone]

## 2014-11-12 ENCOUNTER — Encounter: Payer: Self-pay | Admitting: Cardiology

## 2014-11-12 DIAGNOSIS — Z8249 Family history of ischemic heart disease and other diseases of the circulatory system: Secondary | ICD-10-CM | POA: Insufficient documentation

## 2014-11-12 NOTE — Assessment & Plan Note (Signed)
Well-controlled on moderate-low dose beta blocker plus ACE inhibitor.

## 2014-11-12 NOTE — Assessment & Plan Note (Signed)
Remains asymptomatic from a cardiac standpoint with no anginal or heart failure symptoms. She has had a relatively normal workup in the past. No need to perform screening tests unless she were to have symptoms. Just simply continue to treat risk factors.

## 2014-11-12 NOTE — Assessment & Plan Note (Signed)
On pravastatin. Monitored by PCP. She  thinks that she had been checked by her PCP recently. I discussed results.

## 2014-11-12 NOTE — Assessment & Plan Note (Signed)
Much improved with beta blocker dose. Not too high to cause fatigue.

## 2014-11-12 NOTE — Assessment & Plan Note (Signed)
Unfortunately, she slipped a little bit with her weight loss plan. We discussed healthy diet choices and other modifications as well as different exercise options. She is now has to get motivated to do it.

## 2015-02-24 DIAGNOSIS — H1859 Other hereditary corneal dystrophies: Secondary | ICD-10-CM | POA: Diagnosis not present

## 2015-02-24 DIAGNOSIS — H04123 Dry eye syndrome of bilateral lacrimal glands: Secondary | ICD-10-CM | POA: Diagnosis not present

## 2015-03-31 DIAGNOSIS — E039 Hypothyroidism, unspecified: Secondary | ICD-10-CM | POA: Diagnosis not present

## 2015-03-31 DIAGNOSIS — E782 Mixed hyperlipidemia: Secondary | ICD-10-CM | POA: Diagnosis not present

## 2015-03-31 DIAGNOSIS — R7309 Other abnormal glucose: Secondary | ICD-10-CM | POA: Diagnosis not present

## 2015-04-02 DIAGNOSIS — Z9181 History of falling: Secondary | ICD-10-CM | POA: Diagnosis not present

## 2015-04-02 DIAGNOSIS — I10 Essential (primary) hypertension: Secondary | ICD-10-CM | POA: Diagnosis not present

## 2015-04-02 DIAGNOSIS — Z1389 Encounter for screening for other disorder: Secondary | ICD-10-CM | POA: Diagnosis not present

## 2015-04-02 DIAGNOSIS — R7309 Other abnormal glucose: Secondary | ICD-10-CM | POA: Diagnosis not present

## 2015-04-02 DIAGNOSIS — E039 Hypothyroidism, unspecified: Secondary | ICD-10-CM | POA: Diagnosis not present

## 2015-04-02 DIAGNOSIS — E782 Mixed hyperlipidemia: Secondary | ICD-10-CM | POA: Diagnosis not present

## 2015-07-14 ENCOUNTER — Encounter: Payer: Self-pay | Admitting: Cardiology

## 2015-08-20 DIAGNOSIS — D225 Melanocytic nevi of trunk: Secondary | ICD-10-CM | POA: Diagnosis not present

## 2015-08-20 DIAGNOSIS — L814 Other melanin hyperpigmentation: Secondary | ICD-10-CM | POA: Diagnosis not present

## 2015-08-20 DIAGNOSIS — D1801 Hemangioma of skin and subcutaneous tissue: Secondary | ICD-10-CM | POA: Diagnosis not present

## 2015-08-20 DIAGNOSIS — L821 Other seborrheic keratosis: Secondary | ICD-10-CM | POA: Diagnosis not present

## 2015-08-26 DIAGNOSIS — H1859 Other hereditary corneal dystrophies: Secondary | ICD-10-CM | POA: Diagnosis not present

## 2015-08-26 DIAGNOSIS — H04123 Dry eye syndrome of bilateral lacrimal glands: Secondary | ICD-10-CM | POA: Diagnosis not present

## 2015-09-30 DIAGNOSIS — R7303 Prediabetes: Secondary | ICD-10-CM | POA: Diagnosis not present

## 2015-09-30 DIAGNOSIS — E782 Mixed hyperlipidemia: Secondary | ICD-10-CM | POA: Diagnosis not present

## 2015-09-30 DIAGNOSIS — E039 Hypothyroidism, unspecified: Secondary | ICD-10-CM | POA: Diagnosis not present

## 2015-10-03 DIAGNOSIS — Z9181 History of falling: Secondary | ICD-10-CM | POA: Diagnosis not present

## 2015-10-03 DIAGNOSIS — E782 Mixed hyperlipidemia: Secondary | ICD-10-CM | POA: Diagnosis not present

## 2015-10-03 DIAGNOSIS — I10 Essential (primary) hypertension: Secondary | ICD-10-CM | POA: Diagnosis not present

## 2015-10-03 DIAGNOSIS — E039 Hypothyroidism, unspecified: Secondary | ICD-10-CM | POA: Diagnosis not present

## 2015-10-03 DIAGNOSIS — R7303 Prediabetes: Secondary | ICD-10-CM | POA: Diagnosis not present

## 2015-10-03 DIAGNOSIS — Z23 Encounter for immunization: Secondary | ICD-10-CM | POA: Diagnosis not present

## 2015-10-03 DIAGNOSIS — Z1389 Encounter for screening for other disorder: Secondary | ICD-10-CM | POA: Diagnosis not present

## 2015-11-03 DIAGNOSIS — Z01419 Encounter for gynecological examination (general) (routine) without abnormal findings: Secondary | ICD-10-CM | POA: Diagnosis not present

## 2015-11-03 DIAGNOSIS — Z6833 Body mass index (BMI) 33.0-33.9, adult: Secondary | ICD-10-CM | POA: Diagnosis not present

## 2015-11-03 DIAGNOSIS — Z1231 Encounter for screening mammogram for malignant neoplasm of breast: Secondary | ICD-10-CM | POA: Diagnosis not present

## 2015-11-11 ENCOUNTER — Ambulatory Visit (INDEPENDENT_AMBULATORY_CARE_PROVIDER_SITE_OTHER): Payer: Medicare Other | Admitting: Cardiology

## 2015-11-11 VITALS — BP 104/66 | HR 64 | Ht 65.0 in | Wt 203.8 lb

## 2015-11-11 DIAGNOSIS — E785 Hyperlipidemia, unspecified: Secondary | ICD-10-CM

## 2015-11-11 DIAGNOSIS — E669 Obesity, unspecified: Secondary | ICD-10-CM

## 2015-11-11 DIAGNOSIS — Z8249 Family history of ischemic heart disease and other diseases of the circulatory system: Secondary | ICD-10-CM

## 2015-11-11 DIAGNOSIS — I10 Essential (primary) hypertension: Secondary | ICD-10-CM | POA: Diagnosis not present

## 2015-11-11 DIAGNOSIS — R002 Palpitations: Secondary | ICD-10-CM | POA: Diagnosis not present

## 2015-11-11 MED ORDER — LISINOPRIL 5 MG PO TABS: 5.0000 mg | ORAL_TABLET | Freq: Every day | ORAL | Status: DC

## 2015-11-11 NOTE — Progress Notes (Signed)
PCP: Leonides Sake, MD  Clinic Note: Chief Complaint  Patient presents with  . Annual Exam  . Chest Pain    pt states no chest pain  . Shortness of Breath    no SOB, no light headednessor dizziness  . Edema    no edema     HPI: [name] is a 69 y.o. female with a PMH below who presents today for annual f/u of HTN & HLD & fam Hx of CAD/AS in father.  Has h/o palpitations around timing of Knee replacement.  [name] was last seen in December 2015 - was doing well.  Recent Hospitalizations: none  Studies Reviewed: none  Interval History: She presents with no active CAD symptoms.  No CHF or arrhythmia Sx.  No chest pain or shortness of breath with rest or exertion. No PND, orthopnea or edema. No palpitations, lightheadedness, dizziness, weakness or syncope/near syncope. No TIA/amaurosis fugax symptoms. No melena, hematochezia, hematuria, or epstaxis. No claudication.  ROS: A comprehensive was performed. Review of Systems  Constitutional: Negative for malaise/fatigue.  HENT:       Sinus drainage  Respiratory: Negative for cough (occasional dry cough) and wheezing.   Neurological: Positive for dizziness (can get dizzy if she does not eat enough).  All other systems reviewed and are negative.   Past Medical History  Diagnosis Date  . Hypertension, essential, benign   . Hyperlipidemia   . Mild obesity   . Hypothyroidism   . Depression, controlled     Dysthymia  . Fibromyalgia   . Pes planus (flat feet)   . Cancer ([place])     skin  . OA (osteoarthritis)     Past Surgical History  Procedure Laterality Date  . Transthoracic echocardiogram  April 2010    Normal EF. Relaxation. Otherwise normal  . Nm persantine myoview ltd  April 2010    Breast attenuation, no ischemia or infarction. Normal function  . Carotid dopplers  April 2010    Tortuosity but no stenosis  . Ovarian cyst removal  1967  . Cholecystectomy  1994  . Tubal ligation  1977  . Anterior  cruciate ligament repair  2008  . Total knee arthroplasty  2009   Prior to Admission medications   Medication Sig Start Date End Date Taking? Authorizing Provider  carvedilol (COREG) 6.25 MG tablet Take 1 tablet (6.25 mg total) by mouth 2 (two) times daily with a meal. 11/11/14  Yes Leonie Man, MD  levothyroxine (SYNTHROID, LEVOTHROID) 75 MCG tablet Take 75 mcg by mouth daily before breakfast.   Yes Historical Provider, MD  lisinopril (PRINIVIL,ZESTRIL) 10 MG tablet Take 1 tablet (10 mg total) by mouth daily. 11/11/14  Yes Leonie Man, MD  pravastatin (PRAVACHOL) 20 MG tablet Take 1 tablet (20 mg total) by mouth daily. 11/11/14  Yes Leonie Man, MD  venlafaxine XR (EFFEXOR-XR) 75 MG 24 hr capsule Take 75 mg by mouth daily. 09/24/13  Yes Historical Provider, MD    Allergies  Allergen Reactions  . Compazine [Prochlorperazine Edisylate]     Muscle spasms  . Prochlorperazine Edisylate     REACTION: Extra pyrimadal Syndrome    Social History   Social History  . Marital Status: Married    Spouse Name: N/A  . Number of Children: N/A  . Years of Education: N/A   Social History Main Topics  . Smoking status: Never Smoker   . Smokeless tobacco: None  . Alcohol Use: No  . Drug  Use: No  . Sexual Activity: Not Asked   Other Topics Concern  . None   Social History Narrative   She is a married mother of 2, grandmother 31. She never smoked and does not drink alcohol.   She does not get routine exercise, and admitted he needs to.   Family History  Problem Relation Age of Onset  . Coronary artery disease Father     Status post CABG/AVR, pacemaker  . Aortic stenosis    . Hyperlipidemia Mother   . Stomach cancer Brother   . Kidney cancer Maternal Grandmother   . Stroke Maternal Grandfather   . Stroke Paternal Grandmother   . Stroke Paternal Grandfather     Wt Readings from Last 3 Encounters:  11/11/15 203 lb 12.8 oz (92.443 kg)  11/11/14 207 lb (93.895 kg)  11/06/13 204  lb 11.2 oz (92.851 kg)    PHYSICAL EXAM BP 104/66 mmHg  Pulse 64  Ht 5\' 5"  (1.651 m)  Wt 203 lb 12.8 oz (92.443 kg)  BMI 33.91 kg/m2 General appearance: alert, cooperative, appears stated age, no distress, mildly obese and Pleasant mood and affect. Well-nourished and well-groomed. Neck: no adenopathy, no carotid bruit, no JVD, supple, symmetrical, trachea midline, thyroid not enlarged, symmetric, no tenderness/mass/nodules and HEENT: [place]/AT, EOMI, MMM Lungs: clear to auscultation bilaterally, normal percussion bilaterally and Nonlabored, and air movement. Heart: regular rate and rhythm, S1, S2 normal, no murmur, click, rub or gallop and normal apical impulse Abdomen: soft, non-tender; bowel sounds normal; no masses, no organomegaly and Mild obesity Extremities: extremities normal, atraumatic, no cyanosis or edema and no ulcers, gangrene or trophic changes Pulses: 2+ and symmetric Neurologic: Grossly normal    Adult ECG Report  Rate: 64 ;  Rhythm: normal sinus rhythm and normal axis, intervals & durations;   Narrative Interpretation: stable, Normal EKG   Other studies Reviewed: Additional studies/ records that were reviewed today include:  Recent Labs: 09/30/2015  Na+ 141, K+ 4.6, Cl- 99, HCO3- 23 , BUN 11, Cr 1.08, Glu 101, Ca2+ 9.5; AST 23, ALT 19 AlkP 81, Alb 4.3, TP 7.1, T Bili 0.3  HgbA1c - 6.0; TSH 2.11  TC 157, TG 165, HDL 45, LDL 79 -- great     ASSESSMENT / PLAN: Problem List Items Addressed This Visit    Mild obesity (Chronic)    The patient understands the need to lose weight with diet and exercise. We have discussed specific strategies for this.      Intermittent palpitations (Chronic)    Notably improved on BB.      Relevant Orders   EKG 12-Lead (Completed)   Hypertension, essential, benign - Primary (Chronic)    Well controlled - has been noting some dizziness.  Will reduce ACE-I dose to 5mg .      Relevant Medications   lisinopril (PRINIVIL,ZESTRIL) 5  MG tablet   Other Relevant Orders   EKG 12-Lead (Completed)   Hyperlipidemia (Chronic)    On Pravachol - monitored by PCP. Labs look great!      Relevant Medications   lisinopril (PRINIVIL,ZESTRIL) 5 MG tablet   Other Relevant Orders   EKG 12-Lead (Completed)   Family history of heart disease (Chronic)    Asymptomatic.  Wishes to continue annual f/u. Continues to be active. Rx RFs.         Current medicines are reviewed at length with the patient today. (+/- concerns) occasionally dizzy The following changes have been made:  DECREASE TO 5 MG LISINOPRIL  NO OTHER CHANGES TO MEDICATIONS  Your physician wants you to follow-up in [place].  Studies Ordered:   Orders Placed This Encounter  Procedures  . EKG 12-Lead     Leonie Man, M.D., M.S. Interventional Cardiologist   Pager # [phone]

## 2015-11-11 NOTE — Patient Instructions (Signed)
DECREASE TO 5 MG LISINOPRIL    NO OTHER CHANGES TO MEDICATIONS  Your physician wants you to follow-up in [place].  You will receive a reminder letter in the mail two months in advance. If you don't receive a letter, please call our office to schedule the follow-up appointment.  If you need a refill on your cardiac medications before your next appointment, please call your pharmacy.

## 2015-11-13 ENCOUNTER — Encounter: Payer: Self-pay | Admitting: Cardiology

## 2015-11-13 NOTE — Assessment & Plan Note (Signed)
On Pravachol - monitored by PCP. Labs look great!

## 2015-11-13 NOTE — Assessment & Plan Note (Signed)
Well controlled - has been noting some dizziness.  Will reduce ACE-I dose to 5mg .

## 2015-11-13 NOTE — Assessment & Plan Note (Signed)
The patient understands the need to lose weight with diet and exercise. We have discussed specific strategies for this.  

## 2015-11-13 NOTE — Assessment & Plan Note (Signed)
Asymptomatic.  Wishes to continue annual f/u. Continues to be active. Rx RFs.

## 2015-11-13 NOTE — Assessment & Plan Note (Signed)
Notably improved on BB.

## 2015-12-08 ENCOUNTER — Other Ambulatory Visit: Payer: Self-pay | Admitting: Cardiology

## 2015-12-09 NOTE — Telephone Encounter (Signed)
Rx request sent to pharmacy.  

## 2016-02-26 DIAGNOSIS — H04123 Dry eye syndrome of bilateral lacrimal glands: Secondary | ICD-10-CM | POA: Diagnosis not present

## 2016-03-08 DIAGNOSIS — H1852 Epithelial (juvenile) corneal dystrophy: Secondary | ICD-10-CM | POA: Diagnosis not present

## 2016-03-08 DIAGNOSIS — Z961 Presence of intraocular lens: Secondary | ICD-10-CM | POA: Diagnosis not present

## 2016-03-08 DIAGNOSIS — H02833 Dermatochalasis of right eye, unspecified eyelid: Secondary | ICD-10-CM | POA: Diagnosis not present

## 2016-04-05 DIAGNOSIS — E782 Mixed hyperlipidemia: Secondary | ICD-10-CM | POA: Diagnosis not present

## 2016-04-05 DIAGNOSIS — R7303 Prediabetes: Secondary | ICD-10-CM | POA: Diagnosis not present

## 2016-04-05 DIAGNOSIS — E039 Hypothyroidism, unspecified: Secondary | ICD-10-CM | POA: Diagnosis not present

## 2016-04-08 DIAGNOSIS — E039 Hypothyroidism, unspecified: Secondary | ICD-10-CM | POA: Diagnosis not present

## 2016-04-08 DIAGNOSIS — I10 Essential (primary) hypertension: Secondary | ICD-10-CM | POA: Diagnosis not present

## 2016-04-08 DIAGNOSIS — Z1389 Encounter for screening for other disorder: Secondary | ICD-10-CM | POA: Diagnosis not present

## 2016-04-08 DIAGNOSIS — Z9181 History of falling: Secondary | ICD-10-CM | POA: Diagnosis not present

## 2016-04-08 DIAGNOSIS — Z139 Encounter for screening, unspecified: Secondary | ICD-10-CM | POA: Diagnosis not present

## 2016-04-08 DIAGNOSIS — E782 Mixed hyperlipidemia: Secondary | ICD-10-CM | POA: Diagnosis not present

## 2016-04-08 DIAGNOSIS — R7303 Prediabetes: Secondary | ICD-10-CM | POA: Diagnosis not present

## 2016-11-10 ENCOUNTER — Ambulatory Visit (INDEPENDENT_AMBULATORY_CARE_PROVIDER_SITE_OTHER): Payer: Medicare Other | Admitting: Cardiology

## 2016-11-10 ENCOUNTER — Encounter: Payer: Self-pay | Admitting: Cardiology

## 2016-11-10 VITALS — BP 110/70 | HR 71 | Ht 65.0 in | Wt 202.0 lb

## 2016-11-10 DIAGNOSIS — E784 Other hyperlipidemia: Secondary | ICD-10-CM

## 2016-11-10 DIAGNOSIS — E669 Obesity, unspecified: Secondary | ICD-10-CM | POA: Diagnosis not present

## 2016-11-10 DIAGNOSIS — Z8249 Family history of ischemic heart disease and other diseases of the circulatory system: Secondary | ICD-10-CM | POA: Diagnosis not present

## 2016-11-10 DIAGNOSIS — R002 Palpitations: Secondary | ICD-10-CM | POA: Diagnosis not present

## 2016-11-10 DIAGNOSIS — I10 Essential (primary) hypertension: Secondary | ICD-10-CM | POA: Diagnosis not present

## 2016-11-10 DIAGNOSIS — E7849 Other hyperlipidemia: Secondary | ICD-10-CM

## 2016-11-10 MED ORDER — CARVEDILOL 6.25 MG PO TABS: ORAL_TABLET | ORAL | 3 refills | Status: AC

## 2016-11-10 MED ORDER — PRAVASTATIN SODIUM 20 MG PO TABS: 20.0000 mg | ORAL_TABLET | Freq: Every day | ORAL | 3 refills | Status: AC

## 2016-11-10 MED ORDER — LISINOPRIL 5 MG PO TABS: 5.0000 mg | ORAL_TABLET | Freq: Every day | ORAL | 3 refills | Status: DC

## 2016-11-10 NOTE — Progress Notes (Signed)
PCP: [facility] Note: Chief Complaint  Patient presents with  . Follow-up    pt has no complaints     HPI: [name] is a 70 y.o. female with a PMH below who presents today for annual f/u of HTN & HLD & fam Hx of CAD/AS in father.  Has h/o palpitations around timing of Knee replacement.[name] was last Park in December 2016 - was doing well.  Recent Hospitalizations: none  Studies Reviewed: none  Interval History: [name] presents for her annual follow-up and really has no cardiac symptoms. She has occasional dizziness spells if she gets up too quickly. But otherwise is doing well. She now alleges that she is just not very energetic are active. She has never been that way. She knows that she needs to watch her weight, but has not been motivated to exercise. She does have some back pain from spinal stenosis and neck Lisa neuropathy in makes it less likely for an exercise. Otherwise really from a cardiac standpoint she is quite stable with no active symptoms. Review of symptoms as follows:   No chest pain or shortness of breath with rest or exertion.  No PND, orthopnea or edema. No palpitations, lightheadedness, dizziness, weakness or syncope/near syncope. No TIA/amaurosis fugax symptoms. No claudication.  ROS: A comprehensive was performed. Review of Systems  Constitutional: Negative for chills, fever and malaise/fatigue (Just seems "lazy ").  HENT: Negative for congestion, hearing loss, nosebleeds and sinus pain.   Respiratory: Negative for cough, shortness of breath and wheezing.   Musculoskeletal: Positive for back pain. Negative for falls and myalgias.  Skin: Negative.   Neurological: Positive for dizziness (With bending over).  Psychiatric/Behavioral: Negative for depression. The patient is not nervous/anxious and does not have insomnia.   All other systems reviewed and are negative.   Past Medical History:  Diagnosis Date  . Cancer  ([place])    skin  . Depression, controlled    Dysthymia  . Fibromyalgia   . Hyperlipidemia   . Hypertension, essential, benign   . Hypothyroidism   . Mild obesity   . OA (osteoarthritis)   . Pes planus (flat feet)     Past Surgical History:  Procedure Laterality Date  . ANTERIOR CRUCIATE LIGAMENT REPAIR  2008  . Carotid Dopplers  April 2010   Tortuosity but no stenosis  . CHOLECYSTECTOMY  1994  . NM PERSANTINE MYOVIEW LTD  April 2010   Breast attenuation, no ischemia or infarction. Normal function  . OVARIAN CYST REMOVAL  1967  . TOTAL KNEE ARTHROPLASTY  2009  . TRANSTHORACIC ECHOCARDIOGRAM  April 2010   Normal EF. Relaxation. Otherwise normal  . TUBAL LIGATION  1977   Current Meds  Medication Sig  . carvedilol (COREG) 6.25 MG tablet TAKE 1 TABLET (6.25 MG TOTAL) BY MOUTH 2 (TWO) TIMES DAILY WITH A MEAL.  Marland Kitchen levothyroxine (SYNTHROID, LEVOTHROID) 75 MCG tablet Take 75 mcg by mouth daily before breakfast.  . lisinopril (PRINIVIL,ZESTRIL) 5 MG tablet Take 1 tablet (5 mg total) by mouth daily.  . pravastatin (PRAVACHOL) 20 MG tablet Take 1 tablet (20 mg total) by mouth daily.  Marland Kitchen venlafaxine XR (EFFEXOR-XR) 75 MG 24 hr capsule Take 75 mg by mouth daily.  . [DISCONTINUED] carvedilol (COREG) 6.25 MG tablet TAKE 1 TABLET (6.25 MG TOTAL) BY MOUTH 2 (TWO) TIMES DAILY WITH A MEAL.  . [DISCONTINUED] lisinopril (PRINIVIL,ZESTRIL) 5 MG tablet Take 1 tablet (5 mg total) by mouth daily.  . [  DISCONTINUED] pravastatin (PRAVACHOL) 20 MG tablet Take 1 tablet (20 mg total) by mouth daily.   Allergies  Allergen Reactions  . Compazine [Prochlorperazine Edisylate] Other (See Comments)    Muscle spasms  . Prochlorperazine Edisylate Other (See Comments)    REACTION: Extra pyrimadal Syndrome     Social History   Social History  . Marital status: Married    Spouse name: N/A  . Number of children: N/A  . Years of education: N/A   Social History Main Topics  . Smoking status: Never Smoker  .  Smokeless tobacco: Never Used  . Alcohol use No  . Drug use: No  . Sexual activity: Not Asked   Other Topics Concern  . None   Social History Narrative   She is a married mother of 2, grandmother 81. She never smoked and does not drink alcohol.   She does not get routine exercise, and admitted he needs to.   Family History  Problem Relation Age of Onset  . Coronary artery disease Father     Status post CABG/AVR, pacemaker  . Hyperlipidemia Mother   . Stomach cancer Brother   . Kidney cancer Maternal Grandmother   . Stroke Maternal Grandfather   . Stroke Paternal Grandmother   . Stroke Paternal Grandfather   . Aortic stenosis      Wt Readings from Last 3 Encounters:  11/10/16 91.6 kg (202 lb)  11/11/15 92.4 kg (203 lb 12.8 oz)  11/11/14 93.9 kg (207 lb)    PHYSICAL EXAM BP 110/70 (BP Location: Right Arm, Patient Position: Sitting, Cuff Size: Normal)   Pulse 71   Ht 5\' 5"  (1.651 m)   Wt 91.6 kg (202 lb)   SpO2 98%   BMI 33.61 kg/m  General appearance: alert, cooperative, appears stated age, no distress, mildly obese and Pleasant mood and affect. Well-nourished and well-groomed. Neck: no adenopathy, no carotid bruit, no JVD, supple, symmetrical, trachea midline, thyroid not enlarged, symmetric, no tenderness/mass/nodules and HEENT: [place]/AT, EOMI, MMM Lungs: clear to auscultation bilaterally, normal percussion bilaterally and Nonlabored, and air movement. Heart: regular rate and rhythm, S1, S2 normal, no murmur, click, rub or gallop and normal apical impulse Abdomen: soft, non-tender; bowel sounds normal; no masses, no organomegaly and Mild obesity Extremities: extremities normal, atraumatic, no cyanosis or edema and no ulcers, gangrene or trophic changes Pulses: 2+ and symmetric Neurologic: Grossly normal    Adult ECG Report  Rate: 71 ;  Rhythm: normal sinus rhythm and Normal axis, intervals and durations.;   Narrative Interpretation: Normal EKG   Other studies  Reviewed: Additional studies/ records that were reviewed today include:  Recent Labs:  Reviewed from PCP note back in May 2017. Scan in epic.     ASSESSMENT / PLAN: Problem List Items Addressed This Visit    Family history of heart disease - Primary (Chronic)    Asymptomatic. Initially she wanted to annual follow-up, I think now she thinks that she can go back to when necessary follow-up. She is on a beta blocker and ACE inhibitor as well as statin. She intermittently takes her aspirin.  Continue to recommend exercise.      Intermittent palpitations (Chronic)    Pretty much well controlled and nonexistent on current dose of beta blocker.      Relevant Orders   EKG 12-Lead   Hypertension, essential, benign (Chronic)    Well-controlled current medications. Reduced ACE inhibitor dose last time and her pressure still remains well controlled.  Relevant Medications   lisinopril (PRINIVIL,ZESTRIL) 5 MG tablet   pravastatin (PRAVACHOL) 20 MG tablet   carvedilol (COREG) 6.25 MG tablet   Other Relevant Orders   EKG 12-Lead   Mild obesity (Chronic)    The patient understands the need to lose weight with diet and exercise. We have discussed specific strategies for this.      Relevant Orders   EKG 12-Lead   Hyperlipidemia (Chronic)    Relatively well-controlled on pravastatin. Most recent labs from May of this year showed total cholesterol 162, cholesterol is 138, HDL 50, LDL 84. --I think this is well within goal range for her. No changes.      Relevant Medications   lisinopril (PRINIVIL,ZESTRIL) 5 MG tablet   pravastatin (PRAVACHOL) 20 MG tablet   carvedilol (COREG) 6.25 MG tablet      Current medicines are reviewed at length with the patient today. (+/- concerns) none The following changes have been made: none  Patient Instructions  MEDICATION REFILLED - HAVE PRIMARY REFILL NEXT YEAR    Your physician recommends that you schedule a follow-up appointment AS NEEDED  BASIS      Studies Ordered:   Orders Placed This Encounter  Procedures  . EKG 12-Lead      Glenetta Hew, M.D., M.S. Interventional Cardiologist   Pager # [phone] Phone # [phone] [place], [place] [address]

## 2016-11-10 NOTE — Patient Instructions (Signed)
MEDICATION REFILLED - HAVE PRIMARY REFILL NEXT YEAR    Your physician recommends that you schedule a follow-up appointment AS NEEDED BASIS

## 2016-11-12 ENCOUNTER — Encounter: Payer: Self-pay | Admitting: Cardiology

## 2016-11-12 NOTE — Assessment & Plan Note (Signed)
Asymptomatic. Initially she wanted to annual follow-up, I think now she thinks that she can go back to when necessary follow-up. She is on a beta blocker and ACE inhibitor as well as statin. She intermittently takes her aspirin.  Continue to recommend exercise.

## 2016-11-12 NOTE — Assessment & Plan Note (Signed)
Relatively well-controlled on pravastatin. Most recent labs from May of this year showed total cholesterol 162, cholesterol is 138, HDL 50, LDL 84. --I think this is well within goal range for her. No changes.

## 2016-11-12 NOTE — Assessment & Plan Note (Signed)
Pretty much well controlled and nonexistent on current dose of beta blocker.

## 2016-11-12 NOTE — Assessment & Plan Note (Signed)
Well-controlled current medications. Reduced ACE inhibitor dose last time and her pressure still remains well controlled.

## 2016-11-12 NOTE — Assessment & Plan Note (Signed)
The patient understands the need to lose weight with diet and exercise. We have discussed specific strategies for this.  

## 2017-03-15 DIAGNOSIS — H04123 Dry eye syndrome of bilateral lacrimal glands: Secondary | ICD-10-CM | POA: Diagnosis not present

## 2017-03-15 DIAGNOSIS — H52223 Regular astigmatism, bilateral: Secondary | ICD-10-CM | POA: Diagnosis not present

## 2017-03-15 DIAGNOSIS — H01025 Squamous blepharitis left lower eyelid: Secondary | ICD-10-CM | POA: Diagnosis not present

## 2017-03-15 DIAGNOSIS — H01022 Squamous blepharitis right lower eyelid: Secondary | ICD-10-CM | POA: Diagnosis not present

## 2017-04-11 DIAGNOSIS — R7303 Prediabetes: Secondary | ICD-10-CM | POA: Diagnosis not present

## 2017-04-14 DIAGNOSIS — Z1389 Encounter for screening for other disorder: Secondary | ICD-10-CM | POA: Diagnosis not present

## 2017-04-14 DIAGNOSIS — Z139 Encounter for screening, unspecified: Secondary | ICD-10-CM | POA: Diagnosis not present

## 2017-04-14 DIAGNOSIS — R7303 Prediabetes: Secondary | ICD-10-CM | POA: Diagnosis not present

## 2017-04-14 DIAGNOSIS — I10 Essential (primary) hypertension: Secondary | ICD-10-CM | POA: Diagnosis not present

## 2017-04-14 DIAGNOSIS — E782 Mixed hyperlipidemia: Secondary | ICD-10-CM | POA: Diagnosis not present

## 2017-04-14 DIAGNOSIS — Z9181 History of falling: Secondary | ICD-10-CM | POA: Diagnosis not present

## 2017-04-14 DIAGNOSIS — E039 Hypothyroidism, unspecified: Secondary | ICD-10-CM | POA: Diagnosis not present

## 2017-08-31 DIAGNOSIS — M5412 Radiculopathy, cervical region: Secondary | ICD-10-CM | POA: Diagnosis not present

## 2017-09-15 DIAGNOSIS — Z01812 Encounter for preprocedural laboratory examination: Secondary | ICD-10-CM | POA: Diagnosis not present

## 2017-09-15 DIAGNOSIS — M4802 Spinal stenosis, cervical region: Secondary | ICD-10-CM | POA: Diagnosis not present

## 2017-09-15 DIAGNOSIS — M542 Cervicalgia: Secondary | ICD-10-CM | POA: Diagnosis not present

## 2017-09-15 DIAGNOSIS — M5412 Radiculopathy, cervical region: Secondary | ICD-10-CM | POA: Diagnosis not present

## 2017-09-20 DIAGNOSIS — M50123 Cervical disc disorder at C6-C7 level with radiculopathy: Secondary | ICD-10-CM | POA: Diagnosis not present

## 2017-09-20 DIAGNOSIS — M50223 Other cervical disc displacement at C6-C7 level: Secondary | ICD-10-CM | POA: Diagnosis not present

## 2017-10-12 DIAGNOSIS — R7303 Prediabetes: Secondary | ICD-10-CM | POA: Diagnosis not present

## 2017-10-12 DIAGNOSIS — I10 Essential (primary) hypertension: Secondary | ICD-10-CM | POA: Diagnosis not present

## 2017-10-12 DIAGNOSIS — E039 Hypothyroidism, unspecified: Secondary | ICD-10-CM | POA: Diagnosis not present

## 2017-10-21 DIAGNOSIS — Z23 Encounter for immunization: Secondary | ICD-10-CM | POA: Diagnosis not present

## 2017-10-21 DIAGNOSIS — E039 Hypothyroidism, unspecified: Secondary | ICD-10-CM | POA: Diagnosis not present

## 2017-10-21 DIAGNOSIS — E782 Mixed hyperlipidemia: Secondary | ICD-10-CM | POA: Diagnosis not present

## 2017-10-21 DIAGNOSIS — I10 Essential (primary) hypertension: Secondary | ICD-10-CM | POA: Diagnosis not present

## 2017-10-21 DIAGNOSIS — Z139 Encounter for screening, unspecified: Secondary | ICD-10-CM | POA: Diagnosis not present

## 2017-10-21 DIAGNOSIS — R002 Palpitations: Secondary | ICD-10-CM | POA: Diagnosis not present

## 2017-10-25 DIAGNOSIS — M5412 Radiculopathy, cervical region: Secondary | ICD-10-CM | POA: Diagnosis not present

## 2017-11-02 DIAGNOSIS — N958 Other specified menopausal and perimenopausal disorders: Secondary | ICD-10-CM | POA: Diagnosis not present

## 2017-11-02 DIAGNOSIS — M8588 Other specified disorders of bone density and structure, other site: Secondary | ICD-10-CM | POA: Diagnosis not present

## 2017-11-02 DIAGNOSIS — Z01419 Encounter for gynecological examination (general) (routine) without abnormal findings: Secondary | ICD-10-CM | POA: Diagnosis not present

## 2017-11-02 DIAGNOSIS — Z124 Encounter for screening for malignant neoplasm of cervix: Secondary | ICD-10-CM | POA: Diagnosis not present

## 2017-11-02 DIAGNOSIS — Z1231 Encounter for screening mammogram for malignant neoplasm of breast: Secondary | ICD-10-CM | POA: Diagnosis not present

## 2017-11-23 DIAGNOSIS — M5412 Radiculopathy, cervical region: Secondary | ICD-10-CM | POA: Diagnosis not present

## 2018-03-16 DIAGNOSIS — Z961 Presence of intraocular lens: Secondary | ICD-10-CM | POA: Diagnosis not present

## 2018-04-20 DIAGNOSIS — R7303 Prediabetes: Secondary | ICD-10-CM | POA: Diagnosis not present

## 2018-04-20 DIAGNOSIS — Z0000 Encounter for general adult medical examination without abnormal findings: Secondary | ICD-10-CM | POA: Diagnosis not present

## 2018-04-20 DIAGNOSIS — Z9181 History of falling: Secondary | ICD-10-CM | POA: Diagnosis not present

## 2018-04-20 DIAGNOSIS — E039 Hypothyroidism, unspecified: Secondary | ICD-10-CM | POA: Diagnosis not present

## 2018-04-20 DIAGNOSIS — Z139 Encounter for screening, unspecified: Secondary | ICD-10-CM | POA: Diagnosis not present

## 2018-04-20 DIAGNOSIS — I10 Essential (primary) hypertension: Secondary | ICD-10-CM | POA: Diagnosis not present

## 2018-04-20 DIAGNOSIS — Z1211 Encounter for screening for malignant neoplasm of colon: Secondary | ICD-10-CM | POA: Diagnosis not present

## 2018-04-20 DIAGNOSIS — Z136 Encounter for screening for cardiovascular disorders: Secondary | ICD-10-CM | POA: Diagnosis not present

## 2018-04-20 DIAGNOSIS — E785 Hyperlipidemia, unspecified: Secondary | ICD-10-CM | POA: Diagnosis not present

## 2018-04-26 DIAGNOSIS — I10 Essential (primary) hypertension: Secondary | ICD-10-CM | POA: Diagnosis not present

## 2018-04-26 DIAGNOSIS — E782 Mixed hyperlipidemia: Secondary | ICD-10-CM | POA: Diagnosis not present

## 2018-04-26 DIAGNOSIS — E039 Hypothyroidism, unspecified: Secondary | ICD-10-CM | POA: Diagnosis not present

## 2018-10-31 DIAGNOSIS — R7303 Prediabetes: Secondary | ICD-10-CM | POA: Diagnosis not present

## 2018-10-31 DIAGNOSIS — E039 Hypothyroidism, unspecified: Secondary | ICD-10-CM | POA: Diagnosis not present

## 2018-10-31 DIAGNOSIS — I10 Essential (primary) hypertension: Secondary | ICD-10-CM | POA: Diagnosis not present

## 2018-10-31 DIAGNOSIS — E782 Mixed hyperlipidemia: Secondary | ICD-10-CM | POA: Diagnosis not present

## 2019-01-24 DIAGNOSIS — Z1231 Encounter for screening mammogram for malignant neoplasm of breast: Secondary | ICD-10-CM | POA: Diagnosis not present

## 2019-04-13 ENCOUNTER — Encounter: Payer: Self-pay | Admitting: Gastroenterology

## 2019-04-23 DIAGNOSIS — E785 Hyperlipidemia, unspecified: Secondary | ICD-10-CM | POA: Diagnosis not present

## 2019-04-23 DIAGNOSIS — N959 Unspecified menopausal and perimenopausal disorder: Secondary | ICD-10-CM | POA: Diagnosis not present

## 2019-04-23 DIAGNOSIS — Z9181 History of falling: Secondary | ICD-10-CM | POA: Diagnosis not present

## 2019-04-23 DIAGNOSIS — Z1211 Encounter for screening for malignant neoplasm of colon: Secondary | ICD-10-CM | POA: Diagnosis not present

## 2019-04-23 DIAGNOSIS — Z0000 Encounter for general adult medical examination without abnormal findings: Secondary | ICD-10-CM | POA: Diagnosis not present

## 2019-05-02 DIAGNOSIS — E039 Hypothyroidism, unspecified: Secondary | ICD-10-CM | POA: Diagnosis not present

## 2019-05-02 DIAGNOSIS — R7303 Prediabetes: Secondary | ICD-10-CM | POA: Diagnosis not present

## 2019-05-02 DIAGNOSIS — E782 Mixed hyperlipidemia: Secondary | ICD-10-CM | POA: Diagnosis not present

## 2019-05-02 DIAGNOSIS — I10 Essential (primary) hypertension: Secondary | ICD-10-CM | POA: Diagnosis not present

## 2019-05-03 ENCOUNTER — Telehealth: Payer: Self-pay

## 2019-05-03 NOTE — Telephone Encounter (Signed)
Called to do phone screening before D[facility] appointment tomorrow.  No answer, no voicemail

## 2019-05-04 ENCOUNTER — Ambulatory Visit (INDEPENDENT_AMBULATORY_CARE_PROVIDER_SITE_OTHER): Payer: Medicare Other | Admitting: Gastroenterology

## 2019-05-04 ENCOUNTER — Encounter: Payer: Self-pay | Admitting: Gastroenterology

## 2019-05-04 ENCOUNTER — Telehealth: Payer: Self-pay

## 2019-05-04 ENCOUNTER — Other Ambulatory Visit: Payer: Self-pay

## 2019-05-04 VITALS — Ht 65.75 in | Wt 190.0 lb

## 2019-05-04 DIAGNOSIS — Z1211 Encounter for screening for malignant neoplasm of colon: Secondary | ICD-10-CM

## 2019-05-04 DIAGNOSIS — K5904 Chronic idiopathic constipation: Secondary | ICD-10-CM | POA: Diagnosis not present

## 2019-05-04 DIAGNOSIS — Z1212 Encounter for screening for malignant neoplasm of rectum: Secondary | ICD-10-CM

## 2019-05-04 NOTE — Patient Instructions (Addendum)
Benefiber 1 teaspoon 3 times daily with meals  Colonoscopy, next available appointment.  Will need 2-day bowel prep  Follow-up visit after colonoscopy

## 2019-05-04 NOTE — Progress Notes (Signed)
[name]    [id number]    Sep 15, 1946  Primary Care Physician:[facility], [place]  Referring Physician: Practice, [place], [name]  This service was provided via audio only telemedicine (Telephone) due to [place] 19 pandemic.  Patient location: Home Provider location: Office Used 2 patient identifiers to confirm the correct person. Explained the limitations in evaluation and management via telemedicine. Patient is aware of potential medical charges for this visit.  Patient consented to this virtual visit.  The persons participating in this telemedicine service were myself and the patient    Chief complaint:  Constipation HPI:  73 year old female previously followed by Dr. Sharlett Iles, last seen in office 04/05/2012.  History of chronic constipation, curerntly has bowel movement once every 2 days. Occasionally she has an episode where she may not have a BM for 2 weeks with fecal impaction. None recently. She is taking stool softener and also increased dietary fiber.   Denies any nausea, vomiting, abdominal pain, melena or bright red blood per rectum   Colonoscopy March 05, 2009: Pancolonic diverticulosis, multiple polyps hyperplastic removed from rectum.   Outpatient Encounter Medications as of 05/04/2019  Medication Sig  . carvedilol (COREG) 6.25 MG tablet TAKE 1 TABLET (6.25 MG TOTAL) BY MOUTH 2 (TWO) TIMES DAILY WITH A MEAL.  Marland Kitchen levothyroxine (SYNTHROID, LEVOTHROID) 75 MCG tablet Take 75 mcg by mouth daily before breakfast.  . lisinopril (PRINIVIL,ZESTRIL) 5 MG tablet Take 1 tablet (5 mg total) by mouth daily.  . pravastatin (PRAVACHOL) 20 MG tablet Take 1 tablet (20 mg total) by mouth daily.  Marland Kitchen venlafaxine XR (EFFEXOR-XR) 75 MG 24 hr capsule Take 75 mg by mouth daily.   No facility-administered encounter medications on file as of 05/04/2019.     Allergies as of 05/04/2019 - Review Complete  05/04/2019  Allergen Reaction Noted  . Compazine [prochlorperazine edisylate] Other (See Comments) 11/06/2013  . Prochlorperazine edisylate Other (See Comments) 02/20/2009    Past Medical History:  Diagnosis Date  . Cancer ([place])    skin  . Depression, controlled    Dysthymia  . Fibromyalgia   . Hyperlipidemia   . Hypertension, essential, benign   . Hypothyroidism   . Mild obesity   . OA (osteoarthritis)   . Pes planus (flat feet)     Past Surgical History:  Procedure Laterality Date  . ANTERIOR CRUCIATE LIGAMENT REPAIR  2008  . Carotid Dopplers  April 2010   Tortuosity but no stenosis  . CHOLECYSTECTOMY  1994  . NECK SURGERY    . NM PERSANTINE MYOVIEW LTD  April 2010   Breast attenuation, no ischemia or infarction. Normal function  . OVARIAN CYST REMOVAL  1967  . TOTAL KNEE ARTHROPLASTY  2009  . TRANSTHORACIC ECHOCARDIOGRAM  April 2010   Normal EF. Relaxation. Otherwise normal  . TUBAL LIGATION  1977    Family History  Problem Relation Age of Onset  . Coronary artery disease Father        Status post CABG/AVR, pacemaker  . Hyperlipidemia Mother   . Stomach cancer Brother   . Kidney cancer Maternal Grandmother   . Stroke Maternal Grandfather   . Stroke Paternal Grandmother   . Stroke Paternal Grandfather   . Aortic stenosis Other     Social History   Socioeconomic History  . Marital status: Married    Spouse name: Not on file  . Number of children: Not on  file  . Years of education: Not on file  . Highest education level: Not on file  Occupational History  . Not on file  Social Needs  . Financial resource strain: Not on file  . Food insecurity:    Worry: Not on file    Inability: Not on file  . Transportation needs:    Medical: Not on file    Non-medical: Not on file  Tobacco Use  . Smoking status: Never Smoker  . Smokeless tobacco: Never Used  Substance and Sexual Activity  . Alcohol use: No  . Drug use: No  . Sexual activity: Not on file   Lifestyle  . Physical activity:    Days per week: Not on file    Minutes per session: Not on file  . Stress: Not on file  Relationships  . Social connections:    Talks on phone: Not on file    Gets together: Not on file    Attends religious service: Not on file    Active member of club or organization: Not on file    Attends meetings of clubs or organizations: Not on file    Relationship status: Not on file  . Intimate partner violence:    Fear of current or ex partner: Not on file    Emotionally abused: Not on file    Physically abused: Not on file    Forced sexual activity: Not on file  Other Topics Concern  . Not on file  Social History Narrative   She is a married mother of 2, grandmother 84. She never smoked and does not drink alcohol.   She does not get routine exercise, and admitted he needs to.      Review of systems: Review of Systems as per HPI All other systems reviewed and are negative.   Physical Exam: Vitals were not taken and physical exam was not performed during this virtual visit.  Data Reviewed:  Reviewed labs, radiology imaging, old records and pertinent past GI work up   Assessment and Plan/Recommendations:  67 yr F with hypothyroidism, HTN, hyperlipidemia and chronic constipation Continue high-fiber diet and increase water intake Start Benefiber 1 teaspoon 3 times daily with meals Continue stool softener  Due for colorectal cancer screening, schedule colonoscopy The risks and benefits as well as alternatives of endoscopic procedure(s) have been discussed and reviewed. All questions answered. The patient agrees to proceed.     Damaris Hippo , MD   CC: [name]

## 2019-05-04 NOTE — Telephone Encounter (Signed)
2nd attempt to call to do screening before phone visit.  No answer; no voicemail set up

## 2019-05-04 NOTE — Telephone Encounter (Signed)
Patient screened for phone visit

## 2019-05-06 ENCOUNTER — Encounter: Payer: Self-pay | Admitting: Gastroenterology

## 2019-05-07 DIAGNOSIS — E039 Hypothyroidism, unspecified: Secondary | ICD-10-CM | POA: Diagnosis not present

## 2019-05-07 DIAGNOSIS — R7303 Prediabetes: Secondary | ICD-10-CM | POA: Diagnosis not present

## 2019-05-07 DIAGNOSIS — I10 Essential (primary) hypertension: Secondary | ICD-10-CM | POA: Diagnosis not present

## 2019-06-22 ENCOUNTER — Telehealth: Payer: Self-pay | Admitting: *Deleted

## 2019-06-22 NOTE — Telephone Encounter (Signed)
===  View-only below this line=== ----- Message ----- From: Audrea Muscat, CMA Sent: 05/21/2019 To: Oda Kilts, CMA  Patient wanted to wait to schedule this colonoscopy until July (schedule was not out yet when I spoke to her) so she can get a morning.  She will need a two day prep per Dr. Silverio Decamp

## 2019-08-14 NOTE — Telephone Encounter (Signed)
Called patient today and spoke with her about scheduling her colonoscopy and she needs a Monday, I went through Dr Jillyn Hidden schedule and no Mondays were available  Patient said she would wait for Novembers schedule to come out, I will put in a reminder to call pt back at the end of Oct

## 2019-11-06 DIAGNOSIS — R7303 Prediabetes: Secondary | ICD-10-CM | POA: Diagnosis not present

## 2019-11-06 DIAGNOSIS — E039 Hypothyroidism, unspecified: Secondary | ICD-10-CM | POA: Diagnosis not present

## 2019-11-06 DIAGNOSIS — E782 Mixed hyperlipidemia: Secondary | ICD-10-CM | POA: Diagnosis not present

## 2019-11-08 DIAGNOSIS — I10 Essential (primary) hypertension: Secondary | ICD-10-CM | POA: Diagnosis not present

## 2019-11-08 DIAGNOSIS — Z139 Encounter for screening, unspecified: Secondary | ICD-10-CM | POA: Diagnosis not present

## 2019-11-08 DIAGNOSIS — R7303 Prediabetes: Secondary | ICD-10-CM | POA: Diagnosis not present

## 2019-11-08 DIAGNOSIS — E039 Hypothyroidism, unspecified: Secondary | ICD-10-CM | POA: Diagnosis not present

## 2019-12-05 ENCOUNTER — Telehealth: Payer: Self-pay | Admitting: *Deleted

## 2019-12-05 NOTE — Telephone Encounter (Signed)
Ok, sorry to hear

## 2019-12-05 NOTE — Telephone Encounter (Signed)
Called patient to schedule a colonoscopy and she informed me that her husband has recently passed and she is not wanting to schedule the colonoscopy any time soon  Dr Tarri Glenn

## 2019-12-05 NOTE — Telephone Encounter (Signed)
To: Oda Kilts, CMA Subject: schedule colon a monday                        Call pt at the end of October for A Monday in Nov for her colonoscopy

## 2019-12-20 DIAGNOSIS — E039 Hypothyroidism, unspecified: Secondary | ICD-10-CM | POA: Diagnosis not present

## 2019-12-20 DIAGNOSIS — I10 Essential (primary) hypertension: Secondary | ICD-10-CM | POA: Diagnosis not present

## 2019-12-20 DIAGNOSIS — E782 Mixed hyperlipidemia: Secondary | ICD-10-CM | POA: Diagnosis not present

## 2020-06-09 DIAGNOSIS — F329 Major depressive disorder, single episode, unspecified: Secondary | ICD-10-CM | POA: Diagnosis not present

## 2020-06-09 DIAGNOSIS — F410 Panic disorder [episodic paroxysmal anxiety] without agoraphobia: Secondary | ICD-10-CM | POA: Diagnosis not present

## 2020-06-09 DIAGNOSIS — G43101 Migraine with aura, not intractable, with status migrainosus: Secondary | ICD-10-CM | POA: Diagnosis not present

## 2020-06-09 DIAGNOSIS — E782 Mixed hyperlipidemia: Secondary | ICD-10-CM | POA: Diagnosis not present

## 2020-06-09 DIAGNOSIS — I10 Essential (primary) hypertension: Secondary | ICD-10-CM | POA: Diagnosis not present

## 2020-06-09 DIAGNOSIS — E039 Hypothyroidism, unspecified: Secondary | ICD-10-CM | POA: Diagnosis not present

## 2020-06-13 DIAGNOSIS — Z1231 Encounter for screening mammogram for malignant neoplasm of breast: Secondary | ICD-10-CM | POA: Diagnosis not present

## 2020-06-13 DIAGNOSIS — L900 Lichen sclerosus et atrophicus: Secondary | ICD-10-CM | POA: Diagnosis not present

## 2020-06-13 DIAGNOSIS — N958 Other specified menopausal and perimenopausal disorders: Secondary | ICD-10-CM | POA: Diagnosis not present

## 2020-06-13 DIAGNOSIS — N952 Postmenopausal atrophic vaginitis: Secondary | ICD-10-CM | POA: Diagnosis not present

## 2020-06-13 DIAGNOSIS — Z124 Encounter for screening for malignant neoplasm of cervix: Secondary | ICD-10-CM | POA: Diagnosis not present

## 2020-06-13 DIAGNOSIS — M8588 Other specified disorders of bone density and structure, other site: Secondary | ICD-10-CM | POA: Diagnosis not present

## 2020-06-13 DIAGNOSIS — Z6830 Body mass index (BMI) 30.0-30.9, adult: Secondary | ICD-10-CM | POA: Diagnosis not present

## 2020-06-19 DIAGNOSIS — R7303 Prediabetes: Secondary | ICD-10-CM | POA: Diagnosis not present

## 2020-06-19 DIAGNOSIS — E782 Mixed hyperlipidemia: Secondary | ICD-10-CM | POA: Diagnosis not present

## 2020-06-19 DIAGNOSIS — F329 Major depressive disorder, single episode, unspecified: Secondary | ICD-10-CM | POA: Diagnosis not present

## 2020-06-19 DIAGNOSIS — E669 Obesity, unspecified: Secondary | ICD-10-CM | POA: Diagnosis not present

## 2020-06-19 DIAGNOSIS — I10 Essential (primary) hypertension: Secondary | ICD-10-CM | POA: Diagnosis not present

## 2020-06-19 DIAGNOSIS — E039 Hypothyroidism, unspecified: Secondary | ICD-10-CM | POA: Diagnosis not present

## 2020-06-19 DIAGNOSIS — Z6830 Body mass index (BMI) 30.0-30.9, adult: Secondary | ICD-10-CM | POA: Diagnosis not present

## 2020-06-19 DIAGNOSIS — G43109 Migraine with aura, not intractable, without status migrainosus: Secondary | ICD-10-CM | POA: Diagnosis not present

## 2020-06-19 DIAGNOSIS — Z9889 Other specified postprocedural states: Secondary | ICD-10-CM | POA: Diagnosis not present

## 2020-12-25 DIAGNOSIS — E039 Hypothyroidism, unspecified: Secondary | ICD-10-CM | POA: Diagnosis not present

## 2020-12-25 DIAGNOSIS — E782 Mixed hyperlipidemia: Secondary | ICD-10-CM | POA: Diagnosis not present

## 2020-12-25 DIAGNOSIS — R7303 Prediabetes: Secondary | ICD-10-CM | POA: Diagnosis not present

## 2020-12-29 DIAGNOSIS — E039 Hypothyroidism, unspecified: Secondary | ICD-10-CM | POA: Diagnosis not present

## 2020-12-29 DIAGNOSIS — E782 Mixed hyperlipidemia: Secondary | ICD-10-CM | POA: Diagnosis not present

## 2020-12-29 DIAGNOSIS — Z9889 Other specified postprocedural states: Secondary | ICD-10-CM | POA: Diagnosis not present

## 2020-12-29 DIAGNOSIS — F32A Depression, unspecified: Secondary | ICD-10-CM | POA: Diagnosis not present

## 2020-12-29 DIAGNOSIS — I10 Essential (primary) hypertension: Secondary | ICD-10-CM | POA: Diagnosis not present

## 2020-12-29 DIAGNOSIS — R7303 Prediabetes: Secondary | ICD-10-CM | POA: Diagnosis not present

## 2020-12-29 DIAGNOSIS — Z6831 Body mass index (BMI) 31.0-31.9, adult: Secondary | ICD-10-CM | POA: Diagnosis not present

## 2020-12-29 DIAGNOSIS — Z139 Encounter for screening, unspecified: Secondary | ICD-10-CM | POA: Diagnosis not present

## 2020-12-29 DIAGNOSIS — G43109 Migraine with aura, not intractable, without status migrainosus: Secondary | ICD-10-CM | POA: Diagnosis not present

## 2021-03-17 DIAGNOSIS — R55 Syncope and collapse: Secondary | ICD-10-CM | POA: Diagnosis not present

## 2021-03-17 DIAGNOSIS — Z6831 Body mass index (BMI) 31.0-31.9, adult: Secondary | ICD-10-CM | POA: Diagnosis not present

## 2021-04-26 DIAGNOSIS — R55 Syncope and collapse: Secondary | ICD-10-CM | POA: Insufficient documentation

## 2021-04-26 NOTE — Progress Notes (Signed)
Cardiology Office Note   Date:  04/27/2021   ID:  [name], [name] 02/16/1946, MRN [id number]  PCP:    Leonides Sake, MD  Cardiologist:   Minus Breeding, MD Referring:  Leonides Sake, MD  Chief Complaint  Patient presents with  . Loss of Consciousness      History of Present Illness: [name] is a 75 y.o. female who is referred by Hamrick, Lorin Mercy, MD primary care and there for evaluation of syncope.     She was seen years ago by Dr. Ellyn Hack for palpitations and a family history of CAD.  She had an echo in 2010 that was essentially normal except for mild TR.    She has had no past cardiac history otherwise.  In early April she had an episode of syncope.  She was leaning over looking at some books she had antibiotics.  The next thing she knew she woke up in the box of books.  It was an unwitnessed event.  There was no loss of bowel or bladder.  She had a little trouble getting up because she has some knee problems.  Few days later while walking her dog she had an upset stomach.  She did not feel good when she started to walk.  Eventually she had to lean over and garbage can because she did not feel good and stretching again frank syncope unwitnessed.  She has not done this before or since.  She is not describing orthostatic symptoms.  She is not describing palpitations, presyncope or syncope.  She does have some shortness of breath walking up the stairs into her house with groceries.  She has a relatively sedentary life other than walking down into her yard with her dog.  She is not describing resting shortness of breath, PND or orthopnea.  She is not describing any chest pressure, neck or arm discomfort.  She has had no weight gain or edema.  I do see blood work to include normal thyroid, electrolytes and CBC recently.  Past Medical History:  Diagnosis Date  . Cancer ([place])    skin  . Depression, controlled    Dysthymia  . Fibromyalgia   . Hyperlipidemia   .  Hypertension, essential, benign   . Hypothyroidism   . Mild obesity   . OA (osteoarthritis)   . Pes planus (flat feet)     Past Surgical History:  Procedure Laterality Date  . ANTERIOR CRUCIATE LIGAMENT REPAIR  2008  . Carotid Dopplers  April 2010   Tortuosity but no stenosis  . CHOLECYSTECTOMY  1994  . NECK SURGERY    . NM PERSANTINE MYOVIEW LTD  April 2010   Breast attenuation, no ischemia or infarction. Normal function  . OVARIAN CYST REMOVAL  1967  . TOTAL KNEE ARTHROPLASTY  2009  . TRANSTHORACIC ECHOCARDIOGRAM  April 2010   Normal EF. Relaxation. Otherwise normal  . TUBAL LIGATION  1977     Current Outpatient Medications  Medication Sig Dispense Refill  . carvedilol (COREG) 6.25 MG tablet TAKE 1 TABLET (6.25 MG TOTAL) BY MOUTH 2 (TWO) TIMES DAILY WITH A MEAL. 180 tablet 3  . levothyroxine (SYNTHROID, LEVOTHROID) 75 MCG tablet Take 75 mcg by mouth daily before breakfast.    . lisinopril (ZESTRIL) 2.5 MG tablet Take 1 tablet (2.5 mg total) by mouth daily. 90 tablet 3  . pravastatin (PRAVACHOL) 20 MG tablet Take 1 tablet (20 mg total) by mouth daily. 90 tablet 3  .  venlafaxine XR (EFFEXOR-XR) 150 MG 24 hr capsule Take 150 mg by mouth daily.     No current facility-administered medications for this visit.    Allergies:   Compazine [prochlorperazine edisylate] and Prochlorperazine edisylate    Social History:  The patient  reports that she has never smoked. She has never used smokeless tobacco. She reports that she does not drink alcohol and does not use drugs.   Family History:  The patient's family history includes Aortic stenosis in an other family member; Coronary artery disease in her father; Hyperlipidemia in her mother; Kidney cancer in her maternal grandmother; Stomach cancer in her brother; Stroke in her maternal grandfather, paternal grandfather, and paternal grandmother.    ROS:  Please see the history of present illness.   Otherwise, review of systems are  positive for anxiety, cold feet, probably mild depression.   All other systems are reviewed and negative.    PHYSICAL EXAM: VS:  BP 105/70 (BP Location: Right Arm, Patient Position: Standing, Cuff Size: Large)   Pulse 87   Ht 5\' 5"  (1.651 m)   Wt 192 lb 1.6 oz (87.1 kg)   BMI 31.97 kg/m  , BMI Body mass index is 31.97 kg/m. GENERAL:  Well appearing HEENT:  Pupils equal round and reactive, fundi not visualized, oral mucosa unremarkable NECK:  No jugular venous distention, waveform within normal limits, carotid upstroke brisk and symmetric, no bruits, no thyromegaly LYMPHATICS:  No cervical, inguinal adenopathy LUNGS:  Clear to auscultation bilaterally BACK:  No CVA tenderness CHEST:  Unremarkable HEART:  PMI not displaced or sustained,S1 and S2 within normal limits, no S3, no S4, no clicks, no rubs, no murmurs ABD:  Flat, positive bowel sounds normal in frequency in pitch, no bruits, no rebound, no guarding, no midline pulsatile mass, no hepatomegaly, no splenomegaly EXT:  2 plus pulses throughout, no edema, no cyanosis no clubbing SKIN:  No rashes no nodules NEURO:  Cranial nerves II through XII grossly intact, motor grossly intact throughout PSYCH:  Cognitively intact, oriented to person place and time    EKG:  EKG is ordered today. The ekg ordered today demonstrates sinus rhythm, rate 76, axis within normal limits, intervals within normal limits, no acute ST-T wave changes.   Recent Labs: No results found for requested labs within last 8760 hours.    Lipid Panel No results found for: CHOL, TRIG, HDL, CHOLHDL, VLDL, LDLCALC, LDLDIRECT    Wt Readings from Last 3 Encounters:  04/27/21 192 lb 1.6 oz (87.1 kg)  05/04/19 190 lb (86.2 kg)  11/10/16 202 lb (91.6 kg)      Other studies Reviewed: Additional studies/ records that were reviewed today include: Office records and labs. Review of the above records demonstrates:  Please see elsewhere in the note.     ASSESSMENT  AND PLAN:  SYNCOPE:    Etiology of this is not clear.  She does have a low blood pressure.  I am going to apply a 3-day monitor but I am not strongly suspecting a bradycardia arrhythmia.  I am going to make med changes as below.  If these are isolated events and the monitor is unremarkable I would not suggest further testing.  Of note she was not orthostatic in the office today.  HYPERTENSION: Her blood pressure is running low.  I am going to reduce her lisinopril to 2.5 mg daily.  If her blood pressure continues to run high below the 160V systolic I think we could discontinue the lisinopril altogether.  I would continue the beta-blocker as she has had some tachypalpitations in the past.   Current medicines are reviewed at length with the patient today.  The patient does not have concerns regarding medicines.  The following changes have been made: As above  Labs/ tests ordered today include:   Orders Placed This Encounter  Procedures  . LONG TERM MONITOR (3-14 DAYS)  . EKG 12-Lead     Disposition:   FU with me as needed based on the results of the above or recurrent symptoms   Signed, Minus Breeding, MD  04/27/2021 10:58 AM    [place]

## 2021-04-27 ENCOUNTER — Encounter: Payer: Self-pay | Admitting: Cardiology

## 2021-04-27 ENCOUNTER — Ambulatory Visit: Payer: Medicare HMO | Admitting: Cardiology

## 2021-04-27 ENCOUNTER — Other Ambulatory Visit: Payer: Self-pay

## 2021-04-27 ENCOUNTER — Ambulatory Visit: Payer: Medicare HMO

## 2021-04-27 VITALS — BP 105/70 | HR 87 | Ht 65.0 in | Wt 192.1 lb

## 2021-04-27 DIAGNOSIS — E785 Hyperlipidemia, unspecified: Secondary | ICD-10-CM | POA: Diagnosis not present

## 2021-04-27 DIAGNOSIS — I10 Essential (primary) hypertension: Secondary | ICD-10-CM | POA: Diagnosis not present

## 2021-04-27 DIAGNOSIS — R55 Syncope and collapse: Secondary | ICD-10-CM | POA: Diagnosis not present

## 2021-04-27 MED ORDER — LISINOPRIL 2.5 MG PO TABS: 2.5000 mg | ORAL_TABLET | Freq: Every day | ORAL | 3 refills | Status: AC

## 2021-04-27 NOTE — Progress Notes (Unsigned)
Patient enrolled for [employer] to mail a 3 day ZIO XT monitor to her home.  

## 2021-04-27 NOTE — Patient Instructions (Signed)
Medication Instructions:  Decrease Lisinopril to 2.5 mg   One tablet daily   *If you need a refill on your cardiac medications before your next appointment, please call your pharmacy*   Lab Work: Not needed   Testing/Procedures:   will be mailed to you by the company  From [place] has recommended that you wear a holter monitor 3 day [facility] . Holter monitors are medical devices that record the heart's electrical activity. Doctors most often use these monitors to diagnose arrhythmias. Arrhythmias are problems with the speed or rhythm of the heartbeat. The monitor is a small, portable device. You can wear one while you do your normal daily activities. This is usually used to diagnose what is causing palpitations/syncope (passing out).     Follow-Up: At [facility], you and your health needs are our priority.  As part of our continuing mission to provide you with exceptional heart care, we have created designated Provider Care Teams.  These Care Teams include your primary Cardiologist (physician) and Advanced Practice Providers (APPs -  Physician Assistants and Nurse Practitioners) who all work together to provide you with the care you need, when you need it.  We recommend signing up for the patient portal called "MyChart".  Sign up information is provided on this After Visit Summary.  MyChart is used to connect with patients for Virtual Visits (Telemedicine).  Patients are able to view lab/test results, encounter notes, upcoming appointments, etc.  Non-urgent messages can be sent to your provider as well.   To learn more about what you can do with MyChart, go to [email or web address].    Your next appointment:     as needed if monitor is normal   The format for your next appointment:   In Person  Provider:   Minus Breeding, MD   Other Instructions   [place] Monitor Instructions   Your physician has requested you wear a  ZIO patch monitor for _3__ days.  This is a single patch monitor.   [employer] supplies one patch monitor per enrollment. Additional stickers are not available. Please do not apply patch if you will be having a Nuclear Stress Test, Echocardiogram, Cardiac CT, MRI, or Chest Xray during the period you would be wearing the monitor. The patch cannot be worn during these tests. You cannot remove and re-apply the ZIO XT patch monitor.  Your ZIO patch monitor will be sent Fed Ex from [employer] directly to your home address. It may take 3-5 days to receive your monitor after you have been enrolled.  Once you have received your monitor, please review the enclosed instructions. Your monitor has already been registered assigning a specific monitor serial # to you.  Billing and Patient Assistance Program Information   We have supplied [employer] with any of your insurance information on file for billing purposes. [employer] offers a sliding scale Patient Assistance Program for patients that do not have insurance, or whose insurance does not completely cover the cost of the ZIO monitor.   You must apply for the Patient Assistance Program to qualify for this discounted rate.     To apply, please call [employer] at [phone], select option 4, then select option 2, and ask to apply for Patient Assistance Program.  Theodore Demark will ask your household income, and how many people are in your household.  They will quote your out-of-pocket cost based on that information.  [employer] will also be able to set  up a 11-month, interest-free payment plan if needed.  Applying the monitor   Shave hair from upper left chest.  Hold abrader disc by orange tab. Rub abrader in 40 strokes over the upper left chest as indicated in your monitor instructions.  Clean area with 4 enclosed alcohol pads. Let dry.  Apply patch as indicated in monitor instructions. Patch will be placed under collarbone on left side of chest with arrow pointing  upward.  Rub patch adhesive wings for 2 minutes. Remove white label marked "1". Remove the white label marked "2". Rub patch adhesive wings for 2 additional minutes.  While looking in a mirror, press and release button in center of patch. A small green light will flash 3-4 times. This will be your only indicator that the monitor has been turned on. ?  Do not shower for the first 24 hours. You may shower after the first 24 hours.  Press the button if you feel a symptom. You will hear a small click. Record Date, Time and Symptom in the Patient Logbook.  When you are ready to remove the patch, follow instructions on the last 2 pages of the Patient Logbook. Stick patch monitor onto the last page of Patient Logbook.  Place Patient Logbook in the blue and white box.  Use locking tab on box and tape box closed securely.  The blue and white box has prepaid postage on it. Please place it in the mailbox as soon as possible. Your physician should have your test results approximately 7 days after the monitor has been mailed back to [facility].  Call [place] at [phone] if you have questions regarding your ZIO XT patch monitor. Call them immediately if you see an orange light blinking on your monitor.  If your monitor falls off in less than 4 days, contact our Monitor department at [phone]. ?If your monitor becomes loose or falls off after 4 days call [employer] at [phone] for suggestions on securing your monitor.?

## 2021-06-29 DIAGNOSIS — R7303 Prediabetes: Secondary | ICD-10-CM | POA: Diagnosis not present

## 2021-06-29 DIAGNOSIS — E039 Hypothyroidism, unspecified: Secondary | ICD-10-CM | POA: Diagnosis not present

## 2021-06-29 DIAGNOSIS — E782 Mixed hyperlipidemia: Secondary | ICD-10-CM | POA: Diagnosis not present

## 2021-07-02 DIAGNOSIS — Z6831 Body mass index (BMI) 31.0-31.9, adult: Secondary | ICD-10-CM | POA: Diagnosis not present

## 2021-07-02 DIAGNOSIS — E039 Hypothyroidism, unspecified: Secondary | ICD-10-CM | POA: Diagnosis not present

## 2021-07-02 DIAGNOSIS — I10 Essential (primary) hypertension: Secondary | ICD-10-CM | POA: Diagnosis not present

## 2021-07-02 DIAGNOSIS — F32A Depression, unspecified: Secondary | ICD-10-CM | POA: Diagnosis not present

## 2021-07-02 DIAGNOSIS — R7303 Prediabetes: Secondary | ICD-10-CM | POA: Diagnosis not present

## 2021-07-02 DIAGNOSIS — E782 Mixed hyperlipidemia: Secondary | ICD-10-CM | POA: Diagnosis not present

## 2021-07-02 DIAGNOSIS — Z9181 History of falling: Secondary | ICD-10-CM | POA: Diagnosis not present

## 2021-08-04 DIAGNOSIS — Z01419 Encounter for gynecological examination (general) (routine) without abnormal findings: Secondary | ICD-10-CM | POA: Diagnosis not present

## 2021-08-04 DIAGNOSIS — N952 Postmenopausal atrophic vaginitis: Secondary | ICD-10-CM | POA: Diagnosis not present

## 2021-08-04 DIAGNOSIS — M8580 Other specified disorders of bone density and structure, unspecified site: Secondary | ICD-10-CM | POA: Diagnosis not present

## 2021-08-04 DIAGNOSIS — L900 Lichen sclerosus et atrophicus: Secondary | ICD-10-CM | POA: Diagnosis not present

## 2021-08-04 DIAGNOSIS — Z1231 Encounter for screening mammogram for malignant neoplasm of breast: Secondary | ICD-10-CM | POA: Diagnosis not present

## 2021-11-17 DIAGNOSIS — H26493 Other secondary cataract, bilateral: Secondary | ICD-10-CM | POA: Diagnosis not present

## 2021-11-17 DIAGNOSIS — H18593 Other hereditary corneal dystrophies, bilateral: Secondary | ICD-10-CM | POA: Diagnosis not present

## 2021-11-17 DIAGNOSIS — H04123 Dry eye syndrome of bilateral lacrimal glands: Secondary | ICD-10-CM | POA: Diagnosis not present

## 2021-11-17 DIAGNOSIS — Z961 Presence of intraocular lens: Secondary | ICD-10-CM | POA: Diagnosis not present

## 2021-12-22 DIAGNOSIS — Z961 Presence of intraocular lens: Secondary | ICD-10-CM | POA: Diagnosis not present

## 2021-12-22 DIAGNOSIS — H18413 Arcus senilis, bilateral: Secondary | ICD-10-CM | POA: Diagnosis not present

## 2021-12-22 DIAGNOSIS — H02831 Dermatochalasis of right upper eyelid: Secondary | ICD-10-CM | POA: Diagnosis not present

## 2021-12-22 DIAGNOSIS — H18593 Other hereditary corneal dystrophies, bilateral: Secondary | ICD-10-CM | POA: Diagnosis not present

## 2022-01-05 DIAGNOSIS — R7303 Prediabetes: Secondary | ICD-10-CM | POA: Diagnosis not present

## 2022-01-05 DIAGNOSIS — E782 Mixed hyperlipidemia: Secondary | ICD-10-CM | POA: Diagnosis not present

## 2022-01-05 DIAGNOSIS — E039 Hypothyroidism, unspecified: Secondary | ICD-10-CM | POA: Diagnosis not present

## 2022-01-07 DIAGNOSIS — R7303 Prediabetes: Secondary | ICD-10-CM | POA: Diagnosis not present

## 2022-01-07 DIAGNOSIS — M19042 Primary osteoarthritis, left hand: Secondary | ICD-10-CM | POA: Diagnosis not present

## 2022-01-07 DIAGNOSIS — E039 Hypothyroidism, unspecified: Secondary | ICD-10-CM | POA: Diagnosis not present

## 2022-01-07 DIAGNOSIS — E782 Mixed hyperlipidemia: Secondary | ICD-10-CM | POA: Diagnosis not present

## 2022-01-07 DIAGNOSIS — M19041 Primary osteoarthritis, right hand: Secondary | ICD-10-CM | POA: Diagnosis not present

## 2022-01-07 DIAGNOSIS — Z6831 Body mass index (BMI) 31.0-31.9, adult: Secondary | ICD-10-CM | POA: Diagnosis not present

## 2022-01-07 DIAGNOSIS — I10 Essential (primary) hypertension: Secondary | ICD-10-CM | POA: Diagnosis not present

## 2022-01-07 DIAGNOSIS — Z139 Encounter for screening, unspecified: Secondary | ICD-10-CM | POA: Diagnosis not present

## 2022-01-07 DIAGNOSIS — G43909 Migraine, unspecified, not intractable, without status migrainosus: Secondary | ICD-10-CM | POA: Diagnosis not present

## 2022-02-03 DIAGNOSIS — H02831 Dermatochalasis of right upper eyelid: Secondary | ICD-10-CM | POA: Diagnosis not present

## 2022-02-03 DIAGNOSIS — Z961 Presence of intraocular lens: Secondary | ICD-10-CM | POA: Diagnosis not present

## 2022-02-03 DIAGNOSIS — H18593 Other hereditary corneal dystrophies, bilateral: Secondary | ICD-10-CM | POA: Diagnosis not present

## 2022-02-03 DIAGNOSIS — H26493 Other secondary cataract, bilateral: Secondary | ICD-10-CM | POA: Diagnosis not present

## 2022-03-19 DIAGNOSIS — H18592 Other hereditary corneal dystrophies, left eye: Secondary | ICD-10-CM | POA: Diagnosis not present

## 2022-07-06 DIAGNOSIS — E860 Dehydration: Secondary | ICD-10-CM | POA: Diagnosis not present

## 2022-07-12 DIAGNOSIS — E782 Mixed hyperlipidemia: Secondary | ICD-10-CM | POA: Diagnosis not present

## 2022-07-12 DIAGNOSIS — R7303 Prediabetes: Secondary | ICD-10-CM | POA: Diagnosis not present

## 2022-07-12 DIAGNOSIS — E039 Hypothyroidism, unspecified: Secondary | ICD-10-CM | POA: Diagnosis not present

## 2022-07-14 DIAGNOSIS — Z9181 History of falling: Secondary | ICD-10-CM | POA: Diagnosis not present

## 2022-07-14 DIAGNOSIS — E039 Hypothyroidism, unspecified: Secondary | ICD-10-CM | POA: Diagnosis not present

## 2022-07-14 DIAGNOSIS — E782 Mixed hyperlipidemia: Secondary | ICD-10-CM | POA: Diagnosis not present

## 2022-07-14 DIAGNOSIS — I10 Essential (primary) hypertension: Secondary | ICD-10-CM | POA: Diagnosis not present

## 2022-07-14 DIAGNOSIS — R7303 Prediabetes: Secondary | ICD-10-CM | POA: Diagnosis not present

## 2022-07-14 DIAGNOSIS — Z6830 Body mass index (BMI) 30.0-30.9, adult: Secondary | ICD-10-CM | POA: Diagnosis not present

## 2022-07-14 DIAGNOSIS — G43109 Migraine with aura, not intractable, without status migrainosus: Secondary | ICD-10-CM | POA: Diagnosis not present

## 2022-08-12 DIAGNOSIS — Z6831 Body mass index (BMI) 31.0-31.9, adult: Secondary | ICD-10-CM | POA: Diagnosis not present

## 2022-08-12 DIAGNOSIS — Z01419 Encounter for gynecological examination (general) (routine) without abnormal findings: Secondary | ICD-10-CM | POA: Diagnosis not present

## 2022-08-12 DIAGNOSIS — Z1231 Encounter for screening mammogram for malignant neoplasm of breast: Secondary | ICD-10-CM | POA: Diagnosis not present

## 2022-08-16 DIAGNOSIS — M816 Localized osteoporosis [Lequesne]: Secondary | ICD-10-CM | POA: Diagnosis not present

## 2022-08-26 DIAGNOSIS — M810 Age-related osteoporosis without current pathological fracture: Secondary | ICD-10-CM | POA: Diagnosis not present

## 2022-09-28 DIAGNOSIS — H353131 Nonexudative age-related macular degeneration, bilateral, early dry stage: Secondary | ICD-10-CM | POA: Diagnosis not present

## 2022-09-28 DIAGNOSIS — H26493 Other secondary cataract, bilateral: Secondary | ICD-10-CM | POA: Diagnosis not present

## 2022-09-28 DIAGNOSIS — Z0100 Encounter for examination of eyes and vision without abnormal findings: Secondary | ICD-10-CM | POA: Diagnosis not present

## 2022-09-28 DIAGNOSIS — Z961 Presence of intraocular lens: Secondary | ICD-10-CM | POA: Diagnosis not present

## 2022-09-28 DIAGNOSIS — H18593 Other hereditary corneal dystrophies, bilateral: Secondary | ICD-10-CM | POA: Diagnosis not present

## 2023-01-11 DIAGNOSIS — R7303 Prediabetes: Secondary | ICD-10-CM | POA: Diagnosis not present

## 2023-01-11 DIAGNOSIS — E039 Hypothyroidism, unspecified: Secondary | ICD-10-CM | POA: Diagnosis not present

## 2023-01-11 DIAGNOSIS — E782 Mixed hyperlipidemia: Secondary | ICD-10-CM | POA: Diagnosis not present

## 2023-01-14 DIAGNOSIS — R69 Illness, unspecified: Secondary | ICD-10-CM | POA: Diagnosis not present

## 2023-01-14 DIAGNOSIS — G43109 Migraine with aura, not intractable, without status migrainosus: Secondary | ICD-10-CM | POA: Diagnosis not present

## 2023-01-14 DIAGNOSIS — R7303 Prediabetes: Secondary | ICD-10-CM | POA: Diagnosis not present

## 2023-01-14 DIAGNOSIS — I10 Essential (primary) hypertension: Secondary | ICD-10-CM | POA: Diagnosis not present

## 2023-01-14 DIAGNOSIS — E782 Mixed hyperlipidemia: Secondary | ICD-10-CM | POA: Diagnosis not present

## 2023-01-14 DIAGNOSIS — Z23 Encounter for immunization: Secondary | ICD-10-CM | POA: Diagnosis not present

## 2023-01-14 DIAGNOSIS — Z139 Encounter for screening, unspecified: Secondary | ICD-10-CM | POA: Diagnosis not present

## 2023-01-14 DIAGNOSIS — E039 Hypothyroidism, unspecified: Secondary | ICD-10-CM | POA: Diagnosis not present

## 2023-04-05 DIAGNOSIS — H18593 Other hereditary corneal dystrophies, bilateral: Secondary | ICD-10-CM | POA: Diagnosis not present

## 2023-04-05 DIAGNOSIS — H353131 Nonexudative age-related macular degeneration, bilateral, early dry stage: Secondary | ICD-10-CM | POA: Diagnosis not present

## 2023-07-14 DIAGNOSIS — E782 Mixed hyperlipidemia: Secondary | ICD-10-CM | POA: Diagnosis not present

## 2023-07-14 DIAGNOSIS — R7303 Prediabetes: Secondary | ICD-10-CM | POA: Diagnosis not present

## 2023-07-14 DIAGNOSIS — E039 Hypothyroidism, unspecified: Secondary | ICD-10-CM | POA: Diagnosis not present

## 2023-07-18 DIAGNOSIS — R7303 Prediabetes: Secondary | ICD-10-CM | POA: Diagnosis not present

## 2023-07-18 DIAGNOSIS — E039 Hypothyroidism, unspecified: Secondary | ICD-10-CM | POA: Diagnosis not present

## 2023-07-18 DIAGNOSIS — G43109 Migraine with aura, not intractable, without status migrainosus: Secondary | ICD-10-CM | POA: Diagnosis not present

## 2023-07-18 DIAGNOSIS — Z1331 Encounter for screening for depression: Secondary | ICD-10-CM | POA: Diagnosis not present

## 2023-07-18 DIAGNOSIS — F325 Major depressive disorder, single episode, in full remission: Secondary | ICD-10-CM | POA: Diagnosis not present

## 2023-07-18 DIAGNOSIS — E782 Mixed hyperlipidemia: Secondary | ICD-10-CM | POA: Diagnosis not present

## 2023-07-18 DIAGNOSIS — Z9181 History of falling: Secondary | ICD-10-CM | POA: Diagnosis not present

## 2023-07-18 DIAGNOSIS — I10 Essential (primary) hypertension: Secondary | ICD-10-CM | POA: Diagnosis not present

## 2023-07-21 DIAGNOSIS — Z9181 History of falling: Secondary | ICD-10-CM | POA: Diagnosis not present

## 2023-07-21 DIAGNOSIS — Z1331 Encounter for screening for depression: Secondary | ICD-10-CM | POA: Diagnosis not present

## 2023-07-21 DIAGNOSIS — Z0000 Encounter for general adult medical examination without abnormal findings: Secondary | ICD-10-CM | POA: Diagnosis not present

## 2023-08-16 DIAGNOSIS — Z6830 Body mass index (BMI) 30.0-30.9, adult: Secondary | ICD-10-CM | POA: Diagnosis not present

## 2023-08-16 DIAGNOSIS — Z1231 Encounter for screening mammogram for malignant neoplasm of breast: Secondary | ICD-10-CM | POA: Diagnosis not present

## 2023-08-16 DIAGNOSIS — Z01419 Encounter for gynecological examination (general) (routine) without abnormal findings: Secondary | ICD-10-CM | POA: Diagnosis not present

## 2023-08-22 ENCOUNTER — Other Ambulatory Visit: Payer: Self-pay | Admitting: Obstetrics and Gynecology

## 2023-08-22 DIAGNOSIS — R928 Other abnormal and inconclusive findings on diagnostic imaging of breast: Secondary | ICD-10-CM

## 2023-08-25 ENCOUNTER — Ambulatory Visit
Admission: RE | Admit: 2023-08-25 | Discharge: 2023-08-25 | Disposition: A | Discharge status: Home / Self Care | Payer: Medicare HMO | Source: Ambulatory Visit | Attending: Obstetrics and Gynecology | Admitting: Obstetrics and Gynecology

## 2023-08-25 ENCOUNTER — Ambulatory Visit: Payer: Medicare HMO

## 2023-08-25 DIAGNOSIS — R928 Other abnormal and inconclusive findings on diagnostic imaging of breast: Secondary | ICD-10-CM | POA: Diagnosis not present

## 2023-08-30 ENCOUNTER — Other Ambulatory Visit: Payer: Medicare HMO

## 2023-10-11 DIAGNOSIS — H18593 Other hereditary corneal dystrophies, bilateral: Secondary | ICD-10-CM | POA: Diagnosis not present

## 2023-10-11 DIAGNOSIS — H353131 Nonexudative age-related macular degeneration, bilateral, early dry stage: Secondary | ICD-10-CM | POA: Diagnosis not present

## 2023-12-12 DIAGNOSIS — U071 COVID-19: Secondary | ICD-10-CM | POA: Diagnosis not present

## 2023-12-12 DIAGNOSIS — R6889 Other general symptoms and signs: Secondary | ICD-10-CM | POA: Diagnosis not present

## 2024-02-02 DIAGNOSIS — R7303 Prediabetes: Secondary | ICD-10-CM | POA: Diagnosis not present

## 2024-02-02 DIAGNOSIS — E782 Mixed hyperlipidemia: Secondary | ICD-10-CM | POA: Diagnosis not present

## 2024-02-02 DIAGNOSIS — E039 Hypothyroidism, unspecified: Secondary | ICD-10-CM | POA: Diagnosis not present

## 2024-02-06 DIAGNOSIS — F410 Panic disorder [episodic paroxysmal anxiety] without agoraphobia: Secondary | ICD-10-CM | POA: Diagnosis not present

## 2024-02-06 DIAGNOSIS — I10 Essential (primary) hypertension: Secondary | ICD-10-CM | POA: Diagnosis not present

## 2024-02-06 DIAGNOSIS — Z23 Encounter for immunization: Secondary | ICD-10-CM | POA: Diagnosis not present

## 2024-02-06 DIAGNOSIS — F325 Major depressive disorder, single episode, in full remission: Secondary | ICD-10-CM | POA: Diagnosis not present

## 2024-02-06 DIAGNOSIS — G43109 Migraine with aura, not intractable, without status migrainosus: Secondary | ICD-10-CM | POA: Diagnosis not present

## 2024-02-06 DIAGNOSIS — E782 Mixed hyperlipidemia: Secondary | ICD-10-CM | POA: Diagnosis not present

## 2024-02-06 DIAGNOSIS — L989 Disorder of the skin and subcutaneous tissue, unspecified: Secondary | ICD-10-CM | POA: Diagnosis not present

## 2024-02-06 DIAGNOSIS — R7303 Prediabetes: Secondary | ICD-10-CM | POA: Diagnosis not present

## 2024-02-06 DIAGNOSIS — E039 Hypothyroidism, unspecified: Secondary | ICD-10-CM | POA: Diagnosis not present

## 2024-04-17 DIAGNOSIS — I10 Essential (primary) hypertension: Secondary | ICD-10-CM | POA: Diagnosis not present

## 2024-04-17 DIAGNOSIS — R55 Syncope and collapse: Secondary | ICD-10-CM | POA: Diagnosis not present

## 2024-06-19 DIAGNOSIS — H353131 Nonexudative age-related macular degeneration, bilateral, early dry stage: Secondary | ICD-10-CM | POA: Diagnosis not present

## 2024-08-13 DIAGNOSIS — E782 Mixed hyperlipidemia: Secondary | ICD-10-CM | POA: Diagnosis not present

## 2024-08-13 DIAGNOSIS — R7303 Prediabetes: Secondary | ICD-10-CM | POA: Diagnosis not present

## 2024-08-13 DIAGNOSIS — E039 Hypothyroidism, unspecified: Secondary | ICD-10-CM | POA: Diagnosis not present

## 2024-08-16 DIAGNOSIS — F410 Panic disorder [episodic paroxysmal anxiety] without agoraphobia: Secondary | ICD-10-CM | POA: Diagnosis not present

## 2024-08-16 DIAGNOSIS — E782 Mixed hyperlipidemia: Secondary | ICD-10-CM | POA: Diagnosis not present

## 2024-08-16 DIAGNOSIS — Z139 Encounter for screening, unspecified: Secondary | ICD-10-CM | POA: Diagnosis not present

## 2024-08-16 DIAGNOSIS — I10 Essential (primary) hypertension: Secondary | ICD-10-CM | POA: Diagnosis not present

## 2024-08-16 DIAGNOSIS — F325 Major depressive disorder, single episode, in full remission: Secondary | ICD-10-CM | POA: Diagnosis not present

## 2024-08-16 DIAGNOSIS — Z9181 History of falling: Secondary | ICD-10-CM | POA: Diagnosis not present

## 2024-08-16 DIAGNOSIS — R7303 Prediabetes: Secondary | ICD-10-CM | POA: Diagnosis not present

## 2024-08-16 DIAGNOSIS — G43109 Migraine with aura, not intractable, without status migrainosus: Secondary | ICD-10-CM | POA: Diagnosis not present

## 2024-08-16 DIAGNOSIS — E039 Hypothyroidism, unspecified: Secondary | ICD-10-CM | POA: Diagnosis not present

## 2024-08-22 DIAGNOSIS — Z01419 Encounter for gynecological examination (general) (routine) without abnormal findings: Secondary | ICD-10-CM | POA: Diagnosis not present

## 2024-08-22 DIAGNOSIS — Z6829 Body mass index (BMI) 29.0-29.9, adult: Secondary | ICD-10-CM | POA: Diagnosis not present

## 2024-08-22 DIAGNOSIS — M810 Age-related osteoporosis without current pathological fracture: Secondary | ICD-10-CM | POA: Diagnosis not present

## 2024-08-22 DIAGNOSIS — Z1231 Encounter for screening mammogram for malignant neoplasm of breast: Secondary | ICD-10-CM | POA: Diagnosis not present

## 2024-08-22 DIAGNOSIS — M816 Localized osteoporosis [Lequesne]: Secondary | ICD-10-CM | POA: Diagnosis not present
